# Patient Record
Sex: Male | Born: 1980
Health system: Southern US, Community
[De-identification: ages and names within clinical notes are randomized; demographics above are authoritative.]

## PROBLEM LIST (undated history)

## (undated) DIAGNOSIS — I10 Essential (primary) hypertension: Secondary | ICD-10-CM

## (undated) DIAGNOSIS — R569 Unspecified convulsions: Secondary | ICD-10-CM

## (undated) DIAGNOSIS — F988 Other specified behavioral and emotional disorders with onset usually occurring in childhood and adolescence: Secondary | ICD-10-CM

## (undated) HISTORY — DX: Other specified behavioral and emotional disorders with onset usually occurring in childhood and adolescence: F98.8

## (undated) HISTORY — DX: Unspecified convulsions: R56.9

---

## 2002-12-07 ENCOUNTER — Emergency Department (HOSPITAL_COMMUNITY): Admission: EM | Admit: 2002-12-07 | Discharge: 2002-12-07 | Payer: Self-pay | Admitting: Emergency Medicine

## 2007-08-10 HISTORY — PX: PERCUTANEOUS FIXATION TIBIAL SHAFT FRACTURE W/ PINS / SCREWS: SUR1009

## 2008-05-01 ENCOUNTER — Emergency Department (HOSPITAL_COMMUNITY): Admission: EM | Admit: 2008-05-01 | Discharge: 2008-05-01 | Payer: Self-pay | Admitting: Emergency Medicine

## 2008-07-25 ENCOUNTER — Ambulatory Visit (HOSPITAL_BASED_OUTPATIENT_CLINIC_OR_DEPARTMENT_OTHER): Admission: RE | Admit: 2008-07-25 | Discharge: 2008-07-25 | Payer: Self-pay | Admitting: Orthopedic Surgery

## 2010-12-22 NOTE — Op Note (Signed)
Willie Keller, Willie Keller NO.:  [id number]   MEDICAL RECORD NO.:  [id number]          PATIENT TYPE:  AMB   LOCATION:  DSC                          FACILITY:  [facility]   PHYSICIAN:  Rodney A. Mortenson, M.D.DATE OF BIRTH:  Jan 27, 1981   DATE OF PROCEDURE:  07/25/2008  DATE OF DISCHARGE:                               OPERATIVE REPORT   JUSTIFICATION:  A 30 year old male had a fractured clavicle and had  insertion of Rockwood pin in the left clavicle.  The fracture was  healed.  He is having some irritation at the tip of the pin and the pin  is going to be removed today.  Complication is discussed preoperatively.  Questions are answered and encouraged.   PREOPERATIVE DIAGNOSIS:  Rockwood pin, left clavicle.   POSTOPERATIVE DIAGNOSIS:  Rockwood pin, left clavicle.   OPERATION:  Removal of Rockwood intramedullary clavicle pin, left  shoulder.   SURGEON:  Lenard Galloway. Chaney Malling, MD   ANESTHESIA:  General.   PROCEDURE:  The patient was placed on the operating table in the supine  position.  After satisfactory of anesthesia, the patient was placed in  semi-sitting position.  The left upper extremity was prepped with  DuraPrep and draped out in the usual manner.  An incision was made over  the posterior aspect of the clavicle in the area of the pin exit.  Dissection was carried down to the pin.  Bleeders were coagulated.  A  small bursa was debrided with a rongeur.  A T-handled wrench was placed  over the clavicle pin and the pin was backed out in its entirety without  any complications.  The wound was then closed with interrupted 3-0 nylon  sutures and Steri-Strips with benzoin.  Sterile dressing was applied.  The patient returned to the recovery room in excellent condition.  Technically, this went extremely well.   DISPOSITION:  1. Percocet for pain.  2. To my office next week.  3. Usual postop instructions given to the patient.      Rodney A. Chaney Malling, M.D.  Electronically Signed     RAM/MEDQ  D:  07/25/2008  T:  07/25/2008  Job:  [id number]

## 2011-05-14 LAB — POCT HEMOGLOBIN-HEMACUE: Hemoglobin: 15.4 g/dL (ref 13.0–17.0)

## 2013-05-31 ENCOUNTER — Encounter: Payer: Self-pay | Admitting: Nurse Practitioner

## 2013-06-01 ENCOUNTER — Encounter: Payer: Self-pay | Admitting: Nurse Practitioner

## 2013-06-01 ENCOUNTER — Ambulatory Visit (INDEPENDENT_AMBULATORY_CARE_PROVIDER_SITE_OTHER): Payer: BC Managed Care – PPO | Admitting: Nurse Practitioner

## 2013-06-01 VITALS — BP 144/84 | HR 68 | Ht 73.0 in | Wt 250.0 lb

## 2013-06-01 DIAGNOSIS — G40309 Generalized idiopathic epilepsy and epileptic syndromes, not intractable, without status epilepticus: Secondary | ICD-10-CM | POA: Insufficient documentation

## 2013-06-01 DIAGNOSIS — Z79899 Other long term (current) drug therapy: Secondary | ICD-10-CM

## 2013-06-01 NOTE — Patient Instructions (Signed)
Patient to continue Depakote will refill for one year CBC comprehensive metabolic profile and Depakote level today Followup yearly and when necessary

## 2013-06-01 NOTE — Progress Notes (Signed)
[facility]  PATIENT: Willie Keller DOB: 1980-12-31   REASON FOR VISIT: follow up for seizure    HISTORY OF PRESENT ILLNESS:[name],  32 year old white male returns today for followup. He has a history of primary generalized epilepsy with last seizure being April 2004. He also has a history of attention deficit disorder currently on no medication and says it really has not been a problem  since high school. He is currently on Depakote generic extended release and has tolerated the medication without side effects. No new neurologic complaints. No primary care provider.     REVIEW OF SYSTEMS: Full 14 system review of systems performed and notable only for:  Constitutional: N/A  Cardiovascular: N/A  Ear/Nose/Throat: N/A  Skin: N/A  Eyes: N/A  Respiratory: N/A  Gastroitestinal: N/A  Hematology/Lymphatic: N/A  Endocrine: N/A Musculoskeletal:N/A  Allergy/Immunology: N/A  Neurological: N/A Psychiatric: N/A   ALLERGIES: No Known Allergies  HOME MEDICATIONS: Outpatient Prescriptions Prior to Visit  Medication Sig Dispense Refill  . divalproex (DEPAKOTE ER) 500 MG 24 hr tablet Take 500 mg by mouth 3 (three) times daily. At bedtime       No facility-administered medications prior to visit.    PAST MEDICAL HISTORY: Past Medical History  Diagnosis Date  . ADD (attention deficit disorder)   . Seizures     PAST SURGICAL HISTORY: Past Surgical History  Procedure Laterality Date  . Percutaneous fixation tibial shaft fracture w/ pins / screws  2009    clavicle     FAMILY HISTORY: History reviewed. No pertinent family history.  SOCIAL HISTORY: History   Social History  . Marital Status: Single    Spouse Name: N/A    Number of Children: 0  . Years of Education: College   Occupational History  . Unemployed    Social History Main Topics  . Smoking status: Never Smoker   . Smokeless tobacco: Never Used  . Alcohol Use: Yes     Comment: 64  oz week  . Drug Use: No  . Sexual Activity: Not on file   Other Topics Concern  . Not on file   Social History Narrative   Patient lives at home with with a roommate.    Patient is unemployed.    Patient has a college education.            PHYSICAL EXAM  Filed Vitals:   06/01/13 1001  BP: 144/84  Pulse: 68  Height: 6\' 1"  (1.854 m)  Weight: 250 lb (113.399 kg)   Body mass index is 32.99 kg/(m^2).  Generalized: Well developed, obese male in no acute distress  Neurological examination   Mentation: Alert oriented to time, place, history taking. Follows all commands speech and language fluent  Cranial nerve II-XII: Pupils were equal round reactive to light extraocular movements were full, visual field were full on confrontational test. Facial sensation and strength were normal. Hearing was intact to finger rubbing bilaterally. Uvula tongue midline. head turning and shoulder shrug and were normal and symmetric.Tongue protrusion into cheek strength was normal. Motor: normal bulk and tone, full strength in the BUE, BLE, fine finger movements normal, no pronator drift. No focal weakness Coordination: finger-nose-finger, heel-to-shin bilaterally, no dysmetria Reflexes: Brachioradialis 2/2, biceps 2/2, triceps 2/2, patellar 2/2, Achilles 2/2, plantar responses were flexor bilaterally. Gait and Station: Rising up from seated position without assistance, normal stance,  moderate stride, good arm swing, smooth turning, able to perform tiptoe, and heel walking without difficulty.   DIAGNOSTIC  DATA (LABS, IMAGING, TESTING) - None to review    ASSESSMENT AND PLAN  32 y.o. year old male  has a past medical history of ADD (attention deficit disorder) and Seizures. here to followup. Currently on Depakote 1500 mg total dose last generalized seizure in April 2004 however patient states he had an aura of seizure last month but it did not progress.   Patient to continue Depakote will refill  for one year after labs return CBC , comprehensive metabolic profile and Depakote level today,  Followup yearly and when necessary Needs PCP Nilda Riggs, [facility], [facility], APRN  [facility] [address], [address] [place], [place] [address] [phone]

## 2013-06-02 LAB — COMPREHENSIVE METABOLIC PANEL
Albumin: 4.1 g/dL (ref 3.5–5.5)
Alkaline Phosphatase: 78 IU/L (ref 39–117)
BUN/Creatinine Ratio: 13 (ref 8–19)
BUN: 11 mg/dL (ref 6–20)
Creatinine, Ser: 0.84 mg/dL (ref 0.76–1.27)
GFR calc Af Amer: 136 mL/min/{1.73_m2} (ref >59)
GFR calc non Af Amer: 117 mL/min/{1.73_m2} (ref >59)
Globulin, Total: 2.8 g/dL (ref 1.5–4.5)
Glucose: 91 mg/dL (ref 65–99)
Total Bilirubin: 0.1 mg/dL (ref 0.0–1.2)
Total Protein: 6.9 g/dL (ref 6.0–8.5)

## 2013-06-02 LAB — CBC WITH DIFFERENTIAL/PLATELET
Basos: 1 %
Eos: 7 %
Lymphs: 43 %
MCV: 75 fL — ABNORMAL LOW (ref 79–97)
Monocytes: 10 %
Neutrophils Relative %: 39 %
RBC: 5.12 x10E6/uL (ref 4.14–5.80)
WBC: 9.7 10*3/uL (ref 3.4–10.8)

## 2013-06-04 ENCOUNTER — Other Ambulatory Visit: Payer: Self-pay | Admitting: Nurse Practitioner

## 2013-06-04 MED ORDER — DIVALPROEX SODIUM ER 500 MG PO TB24: ORAL_TABLET | ORAL | Status: DC

## 2013-06-04 NOTE — Progress Notes (Signed)
Quick Note:  I called and gave results normal for pt to home #. ______

## 2013-06-28 ENCOUNTER — Ambulatory Visit: Payer: Self-pay | Admitting: Nurse Practitioner

## 2013-07-19 NOTE — Progress Notes (Signed)
I reviewed note and agree with plan.   VIKRAM R. PENUMALLI, MD 07/19/2013, 12:43 PM Certified in Neurology, Neurophysiology and Neuroimaging  [facility] [address], [address] [place], [place] [address] [phone]  

## 2014-04-09 ENCOUNTER — Ambulatory Visit (INDEPENDENT_AMBULATORY_CARE_PROVIDER_SITE_OTHER): Payer: BC Managed Care – PPO | Admitting: Nurse Practitioner

## 2014-04-09 ENCOUNTER — Encounter: Payer: Self-pay | Admitting: Nurse Practitioner

## 2014-04-09 VITALS — BP 138/80 | HR 70 | Ht 73.0 in | Wt 245.0 lb

## 2014-04-09 DIAGNOSIS — G40309 Generalized idiopathic epilepsy and epileptic syndromes, not intractable, without status epilepticus: Secondary | ICD-10-CM

## 2014-04-09 DIAGNOSIS — Z5181 Encounter for therapeutic drug level monitoring: Secondary | ICD-10-CM

## 2014-04-09 MED ORDER — DIVALPROEX SODIUM ER 500 MG PO TB24: ORAL_TABLET | ORAL | Status: DC

## 2014-04-09 NOTE — Patient Instructions (Signed)
Continue Depakote at current dose. Will check labs today, CBC, CMP, and VPA level. Call for any seizure activity F/U in 1 year and prn

## 2014-04-09 NOTE — Progress Notes (Signed)
[facility]  PATIENT: Willie Keller DOB: 12-04-1980   REASON FOR VISIT: folllowup seizure disorder   HISTORY OF PRESENT ILLNESS:[name], 33 year old white male returns today for followup. He was last seen 06/01/2013. He has a history of primary generalized epilepsy with last seizure being April 2004. He also has a history of attention deficit disorder currently on no medication and says it really has not been a problem since high school. He is currently on Depakote generic extended release and has tolerated the medication without side effects. No new neurologic complaints. No primary care provider.    REVIEW OF SYSTEMS: Full 14 system review of systems performed and notable only for those listed, all others are neg:  Constitutional: N/A  Cardiovascular: N/A  Ear/Nose/Throat: N/A  Skin: N/A  Eyes: N/A  Respiratory: N/A  Gastroitestinal: N/A  Hematology/Lymphatic: N/A  Endocrine: N/A Musculoskeletal:N/A  Allergy/Immunology: N/A  Neurological: N/A Psychiatric: N/A Sleep : NA   ALLERGIES: No Known Allergies  HOME MEDICATIONS: Outpatient Prescriptions Prior to Visit  Medication Sig Dispense Refill  . divalproex (DEPAKOTE ER) 500 MG 24 hr tablet 3 caps at bedtime  90 tablet  11   No facility-administered medications prior to visit.    PAST MEDICAL HISTORY: Past Medical History  Diagnosis Date  . ADD (attention deficit disorder)   . Seizures     PAST SURGICAL HISTORY: Past Surgical History  Procedure Laterality Date  . Percutaneous fixation tibial shaft fracture w/ pins / screws  2009    clavicle     FAMILY HISTORY: History reviewed. No pertinent family history.  SOCIAL HISTORY: History   Social History  . Marital Status: Single    Spouse Name: N/A    Number of Children: 0  . Years of Education: College   Occupational History  . Unemployed    Social History Main Topics  . Smoking status: Never Smoker   . Smokeless tobacco:  Never Used  . Alcohol Use: Yes     Comment: 64 oz week  . Drug Use: No  . Sexual Activity: Not on file   Other Topics Concern  . Not on file   Social History Narrative   Patient lives at home with with a roommate.    Patient is unemployed.    Patient has a college education.            PHYSICAL EXAM  Filed Vitals:   04/09/14 1501  BP: 138/80  Pulse: 70  Height:  (1.854 m)  Weight: 245 lb (111.131 kg)   Body mass index is 32.33 kg/(m^2). Generalized: Well developed, obese male in no acute distress  Neurological examination  Mentation: Alert oriented to time, place, history taking. Follows all commands speech and language fluent  Cranial nerve II-XII: Pupils were equal round reactive to light extraocular movements were full, visual field were full on confrontational test. Facial sensation and strength were normal. Hearing was intact to finger rubbing bilaterally. Uvula tongue midline. head turning and shoulder shrug and were normal and symmetric.Tongue protrusion into cheek strength was normal.  Motor: normal bulk and tone, full strength in the BUE, BLE, fine finger movements normal, no pronator drift. No focal weakness  Coordination: finger-nose-finger, heel-to-shin bilaterally, no dysmetria  Reflexes: Brachioradialis 2/2, biceps 2/2, triceps 2/2, patellar 2/2, Achilles 2/2, plantar responses were flexor bilaterally.  Gait and Station: Rising up from seated position without assistance, normal stance, moderate stride, good arm swing, smooth turning, able to perform tiptoe, and heel walking without  difficulty.    DIAGNOSTIC DATA (LABS, IMAGING, TESTING) - I reviewed patient records, labs, notes, testing and imaging myself where available.  Lab Results  Component Value Date   WBC 9.7 06/01/2013   HGB 12.8 06/01/2013   HCT 38.3 06/01/2013   MCV 75* 06/01/2013      Component Value Date/Time   NA 137 06/01/2013 1047   K 4.2 06/01/2013 1047   CL 103 06/01/2013 1047    CO2 21 06/01/2013 1047   GLUCOSE 91 06/01/2013 1047   BUN 11 06/01/2013 1047   CREATININE 0.84 06/01/2013 1047   CALCIUM 9.1 06/01/2013 1047   PROT 6.9 06/01/2013 1047   AST 17 06/01/2013 1047   ALT 26 06/01/2013 1047   ALKPHOS 78 06/01/2013 1047   BILITOT 0.1 06/01/2013 1047   GFRNONAA 117 06/01/2013 1047   GFRAA 136 06/01/2013 1047    ASSESSMENT AND PLAN  33 y.o. year old male  has a past medical history of ADD (attention deficit disorder) and Seizures. here to followup for seizure disorder. Last seizure in 2004.  Continue Depakote at current dose. Will check labs today, CBC, CMP, and VPA level. Call for any seizure activity F/U in 1 year and prn Nilda Riggs, [facility], APRN  [facility] [address], [address] [place], [place] [address] [phone]

## 2014-04-09 NOTE — Progress Notes (Signed)
I reviewed note and agree with plan.   Suanne Marker, MD 04/09/2014, 5:35 PM Certified in Neurology, Neurophysiology and Neuroimaging  [facility] [address] [place], [place] [address] [phone]

## 2014-04-10 ENCOUNTER — Telehealth: Payer: Self-pay | Admitting: Nurse Practitioner

## 2014-04-10 LAB — COMPREHENSIVE METABOLIC PANEL
A/G RATIO: 1.5 (ref 1.1–2.5)
ALK PHOS: 79 IU/L (ref 39–117)
ALT: 22 IU/L (ref 0–44)
AST: 17 IU/L (ref 0–40)
Albumin: 4.4 g/dL (ref 3.5–5.5)
BILIRUBIN TOTAL: 0.3 mg/dL (ref 0.0–1.2)
BUN/Creatinine Ratio: 13 (ref 8–19)
BUN: 13 mg/dL (ref 6–20)
CALCIUM: 9.4 mg/dL (ref 8.7–10.2)
CO2: 22 mmol/L (ref 18–29)
CREATININE: 1.03 mg/dL (ref 0.76–1.27)
Chloride: 102 mmol/L (ref 97–108)
GFR calc Af Amer: 112 mL/min/{1.73_m2} (ref >59)
GFR calc non Af Amer: 97 mL/min/{1.73_m2} (ref >59)
GLOBULIN, TOTAL: 3 g/dL (ref 1.5–4.5)
Glucose: 80 mg/dL (ref 65–99)
POTASSIUM: 4.8 mmol/L (ref 3.5–5.2)
SODIUM: 141 mmol/L (ref 134–144)
Total Protein: 7.4 g/dL (ref 6.0–8.5)

## 2014-04-10 LAB — CBC WITH DIFFERENTIAL
Basophils Absolute: 0.1 10*3/uL (ref 0.0–0.2)
Basos: 1 %
EOS: 7 %
Eosinophils Absolute: 0.5 10*3/uL — ABNORMAL HIGH (ref 0.0–0.4)
HCT: 41.2 % (ref 37.5–51.0)
Hemoglobin: 13.3 g/dL (ref 12.6–17.7)
IMMATURE GRANS (ABS): 0 10*3/uL (ref 0.0–0.1)
IMMATURE GRANULOCYTES: 0 %
Lymphocytes Absolute: 3 10*3/uL (ref 0.7–3.1)
Lymphs: 40 %
MCH: 23.6 pg — AB (ref 26.6–33.0)
MCHC: 32.3 g/dL (ref 31.5–35.7)
MCV: 73 fL — ABNORMAL LOW (ref 79–97)
MONOS ABS: 0.7 10*3/uL (ref 0.1–0.9)
Monocytes: 9 %
NEUTROS PCT: 43 %
Neutrophils Absolute: 3.2 10*3/uL (ref 1.4–7.0)
PLATELETS: 359 10*3/uL (ref 150–379)
RBC: 5.63 x10E6/uL (ref 4.14–5.80)
RDW: 18.1 % — ABNORMAL HIGH (ref 12.3–15.4)
WBC: 7.5 10*3/uL (ref 3.4–10.8)

## 2014-04-10 LAB — VALPROIC ACID LEVEL: Valproic Acid Lvl: 36 ug/mL — ABNORMAL LOW (ref 50–100)

## 2014-04-10 NOTE — Telephone Encounter (Signed)
TC to patient about VPA level of 37, he reports that he forgot to tell me he had missed the previous night's dose. Made him aware of importance of taking as ordered, to prevent seizure activity. He agrees

## 2014-04-22 ENCOUNTER — Ambulatory Visit: Payer: BC Managed Care – PPO | Admitting: Nurse Practitioner

## 2014-04-29 ENCOUNTER — Ambulatory Visit: Payer: BC Managed Care – PPO | Admitting: Nurse Practitioner

## 2015-04-10 ENCOUNTER — Ambulatory Visit: Payer: BC Managed Care – PPO | Admitting: Diagnostic Neuroimaging

## 2015-04-22 ENCOUNTER — Ambulatory Visit (INDEPENDENT_AMBULATORY_CARE_PROVIDER_SITE_OTHER): Payer: BLUE CROSS/BLUE SHIELD | Admitting: Diagnostic Neuroimaging

## 2015-04-22 ENCOUNTER — Encounter: Payer: Self-pay | Admitting: Diagnostic Neuroimaging

## 2015-04-22 VITALS — BP 146/89 | HR 89 | Ht 73.0 in | Wt 252.0 lb

## 2015-04-22 DIAGNOSIS — G40309 Generalized idiopathic epilepsy and epileptic syndromes, not intractable, without status epilepticus: Secondary | ICD-10-CM | POA: Diagnosis not present

## 2015-04-22 MED ORDER — DIVALPROEX SODIUM ER 500 MG PO TB24: 1000.0000 mg | ORAL_TABLET | Freq: Every day | ORAL | Status: DC

## 2015-04-22 NOTE — Patient Instructions (Signed)
Continue current medication: divalproex.

## 2015-04-22 NOTE — Progress Notes (Addendum)
[facility]  PATIENT: Willie Keller DOB: 04-17-1981  REFERRING CLINICIAN:  HISTORY FROM: patient  REASON FOR VISIT: follow up   HISTORICAL  CHIEF COMPLAINT:  Chief Complaint  Patient presents with  . Epilepsy    rm 7  . Follow-up    1 year    HISTORY OF PRESENT ILLNESS:   UPDATE 04/22/15: Patient doing well. No seizures since last visit. Tolerating medications. Last seizure was in May 2004 in setting of lack of sleep. In the past tried weaning off depakote x 2 in the past, but had breakthrough seizures.  PRIOR HPI (CM and [place]): Prior notes reviewed and summarized: Normal birth and development. Born full-term via C-section. Patient had closed head injury at age 14 years old, falling off 5 foot height from staircase onto concrete surface. No family history of seizures. Patient had history of brief absence seizures lasting 10-15 seconds with staring spell and speech arrest around age 11 years old. Patient also had episode of generalized tonic-clonic seizure at a restaurant while eating dinner with his father in April 1998. Patient was started on Depakote in 1998 by Dr. Sharene Skeans.   REVIEW OF SYSTEMS: Full 14 system review of systems performed and notable only for only as per HPI.  ALLERGIES: No Known Allergies  HOME MEDICATIONS: Outpatient Prescriptions Prior to Visit  Medication Sig Dispense Refill  . divalproex (DEPAKOTE ER) 500 MG 24 hr tablet 3 caps at bedtime 90 tablet 11   No facility-administered medications prior to visit.    PAST MEDICAL HISTORY: Past Medical History  Diagnosis Date  . ADD (attention deficit disorder)   . Seizures     PAST SURGICAL HISTORY: Past Surgical History  Procedure Laterality Date  . Percutaneous fixation tibial shaft fracture w/ pins / screws  2009    clavicle     FAMILY HISTORY: No family history on file.  SOCIAL HISTORY:  Social History   Social History  . Marital Status: Single    Spouse Name:  N/A  . Number of Children: 0  . Years of Education: College   Occupational History  . Unemployed    Social History Main Topics  . Smoking status: Never Smoker   . Smokeless tobacco: Never Used  . Alcohol Use: Yes     Comment: 64 oz week  . Drug Use: No  . Sexual Activity: Not on file   Other Topics Concern  . Not on file   Social History Narrative   Patient lives at home with with a roommate.    Patient is unemployed.    Patient has a college education.           PHYSICAL EXAM  GENERAL EXAM/CONSTITUTIONAL: Vitals:  Filed Vitals:   04/22/15 1453  BP: 146/89  Pulse: 89  Height:  (1.854 m)  Weight: 252 lb (114.306 kg)     Body mass index is 33.25 kg/(m^2).  No exam data present  Patient is in no distress; well developed, nourished and groomed; neck is supple  CARDIOVASCULAR:  Examination of carotid arteries is normal; no carotid bruits  Regular rate and rhythm, no murmurs  Examination of peripheral vascular system by observation and palpation is normal  EYES:  Ophthalmoscopic exam of optic discs and posterior segments is normal; no papilledema or hemorrhages  MUSCULOSKELETAL:  Gait, strength, tone, movements noted in Neurologic exam below  NEUROLOGIC: MENTAL STATUS:  No flowsheet data found.  awake, alert, oriented to person, place and time  recent and  remote memory intact  normal attention and concentration  language fluent, comprehension intact, naming intact,   fund of knowledge appropriate  CRANIAL NERVE:   2nd - no papilledema on fundoscopic exam  2nd, 3rd, 4th, 6th - pupils equal and reactive to light, visual fields full to confrontation, extraocular muscles intact, no nystagmus  5th - facial sensation symmetric  7th - facial strength symmetric  8th - hearing intact  9th - palate elevates symmetrically, uvula midline  11th - shoulder shrug symmetric  12th - tongue protrusion midline  MOTOR:   normal bulk and tone,  full strength in the BUE, BLE  SENSORY:   normal and symmetric to light touch, temperature, vibration    COORDINATION:   finger-nose-finger, fine finger movements normal  REFLEXES:   deep tendon reflexes present and symmetric  GAIT/STATION:   narrow based gait; able to walk tandem; romberg is negative    DIAGNOSTIC DATA (LABS, IMAGING, TESTING) - I reviewed patient records, labs, notes, testing and imaging myself where available.  Lab Results  Component Value Date   WBC 7.5 04/09/2014   HGB 13.3 04/09/2014   HCT 41.2 04/09/2014   MCV 73* 04/09/2014   PLT 359 04/09/2014      Component Value Date/Time   NA 141 04/09/2014 1526   K 4.8 04/09/2014 1526   CL 102 04/09/2014 1526   CO2 22 04/09/2014 1526   GLUCOSE 80 04/09/2014 1526   BUN 13 04/09/2014 1526   CREATININE 1.03 04/09/2014 1526   CALCIUM 9.4 04/09/2014 1526   PROT 7.4 04/09/2014 1526   AST 17 04/09/2014 1526   ALT 22 04/09/2014 1526   ALKPHOS 79 04/09/2014 1526   BILITOT 0.3 04/09/2014 1526   GFRNONAA 97 04/09/2014 1526   GFRAA 112 04/09/2014 1526   VALPROIC ACID LVL  Date Value Ref Range Status  04/09/2014 36* 50 - 100 ug/mL Final    Comment:                                    Detection Limit = 4                            <4 indicates None Detected Toxicity may occur at levels of 100-500. Measurements of free unbound valproic acid may improve the assess- ment of clinical response.  06/01/2013 72 50 - 100 ug/mL Final    Comment:                                    Detection Limit = 4                            <4 indicates None Detected Toxicity may occur at levels of 100-500. Measurements of free unbound valproic acid may improve the assess- ment of clinical response.   No results found for: CHOL, HDL, LDLCALC, LDLDIRECT, TRIG, CHOLHDL No results found for: ZOXW9U No results found for: VITAMINB12 No results found for: TSH   09/10/96 EEG - paroxysmal generalized delta range activity that was  bilateral and usually generalized 09/10/98 EEG - normal 03/30/02 EEG - normal     ASSESSMENT AND PLAN  34 y.o. year old male here with primary generalized epilepsy, doing well on depakote 500mg  BID. Last seizure  in May 2004. Failed weaning off meds x 2 in the past. Will plan for lifelong therapy.   Dx: Generalized idiopathic epilepsy, not intractable, without status epilepticus    PLAN: I spent 25 minutes of face to face time with patient. Greater than 50% of time was spent in counseling and coordination of care with patient. In summary we discussed: - continue divalproex SR 500mg  (2 tabs PO qhs) - nutrition and physical activity strategies reviewed - recommend patient to establish with PCP to monitor BP (144/89)  Orders Placed This Encounter  Procedures  . CBC with Differential/Platelet  . Comprehensive metabolic panel   Meds ordered this encounter  Medications  . divalproex (DEPAKOTE ER) 500 MG 24 hr tablet    Sig: Take 2 tablets (1,000 mg total) by mouth at bedtime.    Dispense:  180 tablet    Refill:  4   Return in about 1 year (around 04/21/2016).    Suanne Marker, MD 04/22/2015, 3:02 PM Certified in Neurology, Neurophysiology and Neuroimaging  [facility] [address] [place], [place] [address] [phone]

## 2015-04-23 LAB — COMPREHENSIVE METABOLIC PANEL
ALK PHOS: 79 IU/L (ref 39–117)
ALT: 44 IU/L (ref 0–44)
AST: 23 IU/L (ref 0–40)
Albumin/Globulin Ratio: 1.5 (ref 1.1–2.5)
Albumin: 4.3 g/dL (ref 3.5–5.5)
BUN/Creatinine Ratio: 13 (ref 8–19)
BUN: 11 mg/dL (ref 6–20)
Bilirubin Total: 0.3 mg/dL (ref 0.0–1.2)
CO2: 24 mmol/L (ref 18–29)
CREATININE: 0.85 mg/dL (ref 0.76–1.27)
Calcium: 10 mg/dL (ref 8.7–10.2)
Chloride: 101 mmol/L (ref 97–108)
GFR calc Af Amer: 133 mL/min/{1.73_m2} (ref >59)
GFR calc non Af Amer: 115 mL/min/{1.73_m2} (ref >59)
GLUCOSE: 92 mg/dL (ref 65–99)
Globulin, Total: 2.8 g/dL (ref 1.5–4.5)
Potassium: 4.3 mmol/L (ref 3.5–5.2)
Sodium: 140 mmol/L (ref 134–144)
Total Protein: 7.1 g/dL (ref 6.0–8.5)

## 2015-04-23 LAB — CBC WITH DIFFERENTIAL/PLATELET
BASOS ABS: 0.1 10*3/uL (ref 0.0–0.2)
Basos: 1 %
EOS (ABSOLUTE): 0.5 10*3/uL — ABNORMAL HIGH (ref 0.0–0.4)
Eos: 6 %
HEMOGLOBIN: 15.3 g/dL (ref 12.6–17.7)
Hematocrit: 45.3 % (ref 37.5–51.0)
IMMATURE GRANULOCYTES: 0 %
Immature Grans (Abs): 0 10*3/uL (ref 0.0–0.1)
LYMPHS ABS: 2.6 10*3/uL (ref 0.7–3.1)
Lymphs: 33 %
MCH: 29 pg (ref 26.6–33.0)
MCHC: 33.8 g/dL (ref 31.5–35.7)
MCV: 86 fL (ref 79–97)
MONOCYTES: 9 %
MONOS ABS: 0.7 10*3/uL (ref 0.1–0.9)
NEUTROS PCT: 51 %
Neutrophils Absolute: 4 10*3/uL (ref 1.4–7.0)
Platelets: 295 10*3/uL (ref 150–379)
RBC: 5.28 x10E6/uL (ref 4.14–5.80)
RDW: 15.7 % — AB (ref 12.3–15.4)
WBC: 7.8 10*3/uL (ref 3.4–10.8)

## 2015-04-24 ENCOUNTER — Telehealth: Payer: Self-pay | Admitting: *Deleted

## 2015-04-24 NOTE — Telephone Encounter (Signed)
Spoke with patient and informed him, per Dr Marjory Lies, all lab results are normal. Patient verbalized understanding, appreciation.

## 2015-05-10 ENCOUNTER — Other Ambulatory Visit: Payer: Self-pay | Admitting: Nurse Practitioner

## 2015-07-10 ENCOUNTER — Telehealth: Payer: Self-pay | Admitting: *Deleted

## 2015-07-10 NOTE — Telephone Encounter (Signed)
Form,[facility] and Dr Marjory LiesPenumalli 07/10/15.

## 2015-07-11 DIAGNOSIS — Z0289 Encounter for other administrative examinations: Secondary | ICD-10-CM

## 2015-07-14 NOTE — Telephone Encounter (Signed)
DMV form on Dr Visteon CorporationPenumalli's desk for review, signature.

## 2015-07-16 NOTE — Telephone Encounter (Signed)
DMV forms completed, signed, sent to [facility] for processing. 

## 2015-07-17 ENCOUNTER — Telehealth: Payer: Self-pay | Admitting: *Deleted

## 2015-07-17 NOTE — Telephone Encounter (Signed)
Form,DMV received,completd by Dr Marjory LiesPenumalli and Pincus SanesMary C faxed 07/17/15.

## 2016-04-13 ENCOUNTER — Encounter: Payer: Self-pay | Admitting: Diagnostic Neuroimaging

## 2016-04-13 ENCOUNTER — Ambulatory Visit (INDEPENDENT_AMBULATORY_CARE_PROVIDER_SITE_OTHER): Payer: BLUE CROSS/BLUE SHIELD | Admitting: Diagnostic Neuroimaging

## 2016-04-13 VITALS — BP 143/84 | HR 71 | Wt 241.0 lb

## 2016-04-13 DIAGNOSIS — G40309 Generalized idiopathic epilepsy and epileptic syndromes, not intractable, without status epilepticus: Secondary | ICD-10-CM | POA: Diagnosis not present

## 2016-04-13 MED ORDER — DIVALPROEX SODIUM ER 500 MG PO TB24: 1000.0000 mg | ORAL_TABLET | Freq: Every day | ORAL | 4 refills | Status: DC

## 2016-04-13 NOTE — Patient Instructions (Addendum)
Thank you for coming to see [place] at [facility]. I hope we have been able to provide you high quality care today.  You may receive a patient satisfaction survey over the next few weeks. We would appreciate your feedback and comments so that we may continue to improve ourselves and the health of our patients.  - consider smart scale and smart BP machine at home to monitor health factors   ~~~~~~~~~~~~~~~~~~~~~~~~~~~~~~~~~~~~~~~~~~~~~~~~~~~~~~~~~~~~~~~~~  DR. PENUMALLI'S GUIDE TO HAPPY AND HEALTHY LIVING These are some of my general health and wellness recommendations. Some of them may apply to you better than others. Please use common sense as you try these suggestions and feel free to ask me any questions.   ACTIVITY/FITNESS Mental, social, emotional and physical stimulation are very important for brain and body health. Try learning a new activity (arts, music, language, sports, games).  Keep moving your body to the best of your abilities. You can do this at home, inside or outside, the park, community center, gym or anywhere you like. Consider a physical therapist or personal trainer to get started. Consider the app Sworkit. Fitness trackers such as smart-watches, smart-phones or Fitbits can help as well.   NUTRITION Eat more plants: colorful vegetables, nuts, seeds and berries.  Eat less sugar, salt, preservatives and processed foods.  Avoid toxins such as cigarettes and alcohol.  Drink water when you are thirsty. Warm water with a slice of lemon is an excellent morning drink to start the day.  Consider these websites for more information The Nutrition Source (https://www.henry-hernandez.biz/) Precision Nutrition (WindowBlog.ch)   RELAXATION Consider practicing mindfulness meditation or other relaxation techniques such as deep breathing, prayer, yoga, tai chi, massage. See website mindful.org or the apps Headspace or Calm  to help get started.   SLEEP Try to get at least 7-8+ hours sleep per day. Regular exercise and reduced caffeine will help you sleep better. Practice good sleep hygeine techniques. See website sleep.org for more information.   PLANNING Prepare estate planning, living will, healthcare POA documents. Sometimes this is best planned with the help of an attorney. Theconversationproject.org and agingwithdignity.org are excellent resources.

## 2016-04-13 NOTE — Progress Notes (Signed)
[facility]  PATIENT: Willie Keller DOB: 01-24-1981  REFERRING CLINICIAN:  HISTORY FROM: patient  REASON FOR VISIT: follow up   HISTORICAL  CHIEF COMPLAINT:  Chief Complaint  Patient presents with  . Seizures    rm 7, "no seizure activity"  . Follow-up    one year    HISTORY OF PRESENT ILLNESS:   UPDATE 04/13/16: Since last visit. No seizures. Tolerating meds. Still with slightly elevated BP. No PCP yet.  UPDATE 04/22/15: Patient doing well. No seizures since last visit. Tolerating medications. Last seizure was in May 2004 in setting of lack of sleep. In the past tried weaning off depakote x 2 in the past, but had breakthrough seizures.  PRIOR HPI (CM and Hickling): Prior notes reviewed and summarized: Normal birth and development. Born full-term via C-section. Patient had closed head injury at age 47 years old, falling off 5 foot height from staircase onto concrete surface. No family history of seizures. Patient had history of brief absence seizures lasting 10-15 seconds with staring spell and speech arrest around age 39 years old. Patient also had episode of generalized tonic-clonic seizure at a restaurant while eating dinner with his father in April 1998. Patient was started on Depakote in 1998 by Dr. Sharene Skeans.   REVIEW OF SYSTEMS: Full 14 system review of systems performed and negative except as per HPI.   ALLERGIES: No Known Allergies  HOME MEDICATIONS: Outpatient Medications Prior to Visit  Medication Sig Dispense Refill  . divalproex (DEPAKOTE ER) 500 MG 24 hr tablet Take 2 tablets (1,000 mg total) by mouth at bedtime. 180 tablet 4  . divalproex (DEPAKOTE ER) 500 MG 24 hr tablet Take 2 tablets (1,000 mg total) by mouth at bedtime. 180 tablet 4   No facility-administered medications prior to visit.     PAST MEDICAL HISTORY: Past Medical History:  Diagnosis Date  . ADD (attention deficit disorder)   . Seizures (HCC)     PAST SURGICAL  HISTORY: Past Surgical History:  Procedure Laterality Date  . PERCUTANEOUS FIXATION TIBIAL SHAFT FRACTURE W/ PINS / SCREWS  2009   clavicle     FAMILY HISTORY: Family History  Problem Relation Age of Onset  . Healthy Mother   . Healthy Father   . Healthy Sister     SOCIAL HISTORY:  Social History   Social History  . Marital status: Single    Spouse name: N/A  . Number of children: 0  . Years of education: College   Occupational History  . Unemployed    Social History Main Topics  . Smoking status: Current Every Day Smoker    Types: Cigars  . Smokeless tobacco: Never Used     Comment: 04/13/16 cigars once a month  . Alcohol use 2.4 oz/week    4 Shots of liquor per week  . Drug use: No  . Sexual activity: Not on file   Other Topics Concern  . Not on file   Social History Narrative   Patient lives at home with with a roommate.    Patient is unemployed.    Patient has a college education.           PHYSICAL EXAM  GENERAL EXAM/CONSTITUTIONAL: Vitals:  Vitals:   04/13/16 1103  BP: (!) 143/84  Pulse: 71  Weight: 241 lb (109.3 kg)   Body mass index is 31.8 kg/m. No exam data present  Patient is in no distress; well developed, nourished and groomed; neck is supple  CARDIOVASCULAR:  Examination of carotid arteries is normal; no carotid bruits  Regular rate and rhythm, no murmurs  Examination of peripheral vascular system by observation and palpation is normal  EYES:  Ophthalmoscopic exam of optic discs and posterior segments is normal; no papilledema or hemorrhages  MUSCULOSKELETAL:  Gait, strength, tone, movements noted in Neurologic exam below  NEUROLOGIC: MENTAL STATUS:  No flowsheet data found.  awake, alert, oriented to person, place and time  recent and remote memory intact  normal attention and concentration  language fluent, comprehension intact, naming intact,   fund of knowledge appropriate  CRANIAL NERVE:   2nd - no  papilledema on fundoscopic exam  2nd, 3rd, 4th, 6th - pupils equal and reactive to light, visual fields full to confrontation, extraocular muscles intact, no nystagmus  5th - facial sensation symmetric  7th - facial strength symmetric  8th - hearing intact  9th - palate elevates symmetrically, uvula midline  11th - shoulder shrug symmetric  12th - tongue protrusion midline  MOTOR:   normal bulk and tone, full strength in the BUE, BLE  SENSORY:   normal and symmetric to light touch, temperature, vibration    COORDINATION:   finger-nose-finger, fine finger movements normal  REFLEXES:   deep tendon reflexes present and symmetric  GAIT/STATION:   narrow based gait; able to walk tandem; romberg is negative    DIAGNOSTIC DATA (LABS, IMAGING, TESTING) - I reviewed patient records, labs, notes, testing and imaging myself where available.  Lab Results  Component Value Date   WBC 7.8 04/22/2015   HGB 13.3 04/09/2014   HCT 45.3 04/22/2015   MCV 86 04/22/2015   PLT 295 04/22/2015      Component Value Date/Time   NA 140 04/22/2015 1612   K 4.3 04/22/2015 1612   CL 101 04/22/2015 1612   CO2 24 04/22/2015 1612   GLUCOSE 92 04/22/2015 1612   BUN 11 04/22/2015 1612   CREATININE 0.85 04/22/2015 1612   CALCIUM 10.0 04/22/2015 1612   PROT 7.1 04/22/2015 1612   ALBUMIN 4.3 04/22/2015 1612   AST 23 04/22/2015 1612   ALT 44 04/22/2015 1612   ALKPHOS 79 04/22/2015 1612   BILITOT 0.3 04/22/2015 1612   GFRNONAA 115 04/22/2015 1612   GFRAA 133 04/22/2015 1612   Valproic Acid Lvl  Date Value Ref Range Status  04/09/2014 36 (L) 50 - 100 ug/mL Final    Comment:                                    Detection Limit = 4                            <4 indicates None Detected Toxicity may occur at levels of 100-500. Measurements of free unbound valproic acid may improve the assess- ment of clinical response.  06/01/2013 72 50 - 100 ug/mL Final    Comment:                                     Detection Limit = 4                            <4 indicates None Detected Toxicity may occur at levels of 100-500. Measurements of free unbound valproic acid  may improve the assess- ment of clinical response.   No results found for: CHOL, HDL, LDLCALC, LDLDIRECT, TRIG, CHOLHDL No results found for: ZOXW9UHGBA1C No results found for: VITAMINB12 No results found for: TSH   09/10/96 EEG - paroxysmal generalized delta range activity that was bilateral and usually generalized 09/10/98 EEG - normal 03/30/02 EEG - normal     ASSESSMENT AND PLAN  35 y.o. year old male here with primary generalized epilepsy, doing well on depakote 500mg  BID. Last seizure in May 2004. Failed weaning off meds x 2 in the past. Will plan for lifelong therapy.    Dx:  Generalized idiopathic epilepsy, not intractable, without status epilepticus (HCC) - Plan: CBC with Differential/Platelet, CMP    PLAN: I spent 25 minutes of face to face time with patient. Greater than 50% of time was spent in counseling and coordination of care with patient. In summary we discussed: - continue divalproex ER 500mg  (2 tabs PO qhs) - nutrition and physical activity strategies reviewed - recommend patient to establish with PCP to monitor BP (143/84); reviewed nutrition, exercise, alcohol and sleep related factors; advised to consider BP and weight monitoring at home   Orders Placed This Encounter  Procedures  . CBC with Differential/Platelet  . CMP   Meds ordered this encounter  Medications  . divalproex (DEPAKOTE ER) 500 MG 24 hr tablet    Sig: Take 2 tablets (1,000 mg total) by mouth at bedtime.    Dispense:  180 tablet    Refill:  4   Return in about 1 year (around 04/13/2017).    Suanne MarkerVIKRAM R. Jermon Chalfant, MD 04/13/2016, 11:18 AM Certified in Neurology, Neurophysiology and Neuroimaging  [facility] [address], [address] [place], [place] [id number] [phone]

## 2016-04-14 ENCOUNTER — Telehealth: Payer: Self-pay | Admitting: *Deleted

## 2016-04-14 LAB — COMPREHENSIVE METABOLIC PANEL
ALK PHOS: 74 IU/L (ref 39–117)
ALT: 21 IU/L (ref 0–44)
AST: 14 IU/L (ref 0–40)
Albumin/Globulin Ratio: 1.5 (ref 1.2–2.2)
Albumin: 4.2 g/dL (ref 3.5–5.5)
BUN/Creatinine Ratio: 18 (ref 9–20)
BUN: 16 mg/dL (ref 6–20)
Bilirubin Total: 0.3 mg/dL (ref 0.0–1.2)
CALCIUM: 9.3 mg/dL (ref 8.7–10.2)
CO2: 24 mmol/L (ref 18–29)
CREATININE: 0.9 mg/dL (ref 0.76–1.27)
Chloride: 103 mmol/L (ref 96–106)
GFR calc Af Amer: 130 mL/min/{1.73_m2} (ref >59)
GFR, EST NON AFRICAN AMERICAN: 113 mL/min/{1.73_m2} (ref >59)
GLUCOSE: 85 mg/dL (ref 65–99)
Globulin, Total: 2.8 g/dL (ref 1.5–4.5)
Potassium: 4.3 mmol/L (ref 3.5–5.2)
Sodium: 143 mmol/L (ref 134–144)
Total Protein: 7 g/dL (ref 6.0–8.5)

## 2016-04-14 LAB — CBC WITH DIFFERENTIAL/PLATELET
BASOS ABS: 0 10*3/uL (ref 0.0–0.2)
Basos: 0 %
EOS (ABSOLUTE): 0.4 10*3/uL (ref 0.0–0.4)
EOS: 5 %
Hematocrit: 45 % (ref 37.5–51.0)
Hemoglobin: 15.1 g/dL (ref 12.6–17.7)
IMMATURE GRANULOCYTES: 0 %
Immature Grans (Abs): 0 10*3/uL (ref 0.0–0.1)
Lymphocytes Absolute: 3.2 10*3/uL — ABNORMAL HIGH (ref 0.7–3.1)
Lymphs: 42 %
MCH: 30.6 pg (ref 26.6–33.0)
MCHC: 33.6 g/dL (ref 31.5–35.7)
MCV: 91 fL (ref 79–97)
MONOS ABS: 0.7 10*3/uL (ref 0.1–0.9)
Monocytes: 9 %
NEUTROS PCT: 44 %
Neutrophils Absolute: 3.3 10*3/uL (ref 1.4–7.0)
PLATELETS: 267 10*3/uL (ref 150–379)
RBC: 4.93 x10E6/uL (ref 4.14–5.80)
RDW: 14.8 % (ref 12.3–15.4)
WBC: 7.5 10*3/uL (ref 3.4–10.8)

## 2016-04-14 NOTE — Telephone Encounter (Signed)
Per Dr Danae OrleansPenumali, spoke with patient and informed him his labs are unremarkable; Dr Marjory LiesPenumalli will continue with his current treatment plan. Patient verbalized understanding.

## 2017-04-18 ENCOUNTER — Encounter: Payer: Self-pay | Admitting: Diagnostic Neuroimaging

## 2017-04-18 ENCOUNTER — Ambulatory Visit (INDEPENDENT_AMBULATORY_CARE_PROVIDER_SITE_OTHER): Payer: BLUE CROSS/BLUE SHIELD | Admitting: Diagnostic Neuroimaging

## 2017-04-18 VITALS — BP 143/89 | HR 68 | Ht 73.0 in | Wt 253.0 lb

## 2017-04-18 DIAGNOSIS — G40309 Generalized idiopathic epilepsy and epileptic syndromes, not intractable, without status epilepticus: Secondary | ICD-10-CM | POA: Diagnosis not present

## 2017-04-18 MED ORDER — DIVALPROEX SODIUM ER 500 MG PO TB24: 1000.0000 mg | ORAL_TABLET | Freq: Every day | ORAL | 4 refills | Status: DC

## 2017-04-18 NOTE — Progress Notes (Signed)
[facility]  PATIENT: Willie Keller DOB: 1981/01/20  REFERRING CLINICIAN:  HISTORY FROM: patient REASON FOR VISIT: follow up   HISTORICAL  CHIEF COMPLAINT:  Chief Complaint  Patient presents with  . Follow-up  . Seizures    doing well, no sz    HISTORY OF PRESENT ILLNESS:   UPDATE (04/18/17, VRP): Since last visit, doing well. Tolerating divalproex. No alleviating or aggravating factors. No seizures.  UPDATE 04/13/16: Since last visit. No seizures. Tolerating meds. Still with slightly elevated BP. No PCP yet.  UPDATE 04/22/15: Patient doing well. No seizures since last visit. Tolerating medications. Last seizure was in May 2004 in setting of lack of sleep. In the past tried weaning off depakote x 2 in the past, but had breakthrough seizures.  PRIOR HPI (CM and Hickling): Prior notes reviewed and summarized: Normal birth and development. Born full-term via C-section. Patient had closed head injury at age 36 years old, falling off 5 foot height from staircase onto concrete surface. No family history of seizures. Patient had history of brief absence seizures lasting 10-15 seconds with staring spell and speech arrest around age 36 years old. Patient also had episode of generalized tonic-clonic seizure at a restaurant while eating dinner with his father in April 1998. Patient was started on Depakote in 1998 by Dr. Sharene SkeansHickling.   REVIEW OF SYSTEMS: Full 14 system review of systems performed and negative except: only as per HPI.    ALLERGIES: No Known Allergies  HOME MEDICATIONS: Outpatient Medications Prior to Visit  Medication Sig Dispense Refill  . divalproex (DEPAKOTE ER) 500 MG 24 hr tablet Take 2 tablets (1,000 mg total) by mouth at bedtime. 180 tablet 4   No facility-administered medications prior to visit.     PAST MEDICAL HISTORY: Past Medical History:  Diagnosis Date  . ADD (attention deficit disorder)   . Seizures (HCC)     PAST SURGICAL  HISTORY: Past Surgical History:  Procedure Laterality Date  . PERCUTANEOUS FIXATION TIBIAL SHAFT FRACTURE W/ PINS / SCREWS  2009   clavicle     FAMILY HISTORY: Family History  Problem Relation Age of Onset  . Healthy Mother   . Healthy Father   . Healthy Sister     SOCIAL HISTORY:  Social History   Social History  . Marital status: Single    Spouse name: N/A  . Number of children: 0  . Years of education: College   Occupational History  . Unemployed    Social History Main Topics  . Smoking status: Current Every Day Smoker    Types: Cigars  . Smokeless tobacco: Never Used     Comment: 04/13/16 cigars once a month  . Alcohol use 2.4 oz/week    4 Shots of liquor per week  . Drug use: No  . Sexual activity: Not on file   Other Topics Concern  . Not on file   Social History Narrative   Patient lives at home with with a roommate.    Patient is unemployed.    Patient has a college education.           PHYSICAL EXAM  GENERAL EXAM/CONSTITUTIONAL: Vitals:  Vitals:   04/18/17 1135  BP: (!) 143/89  Pulse: 68  Weight: 253 lb (114.8 kg)  Height: 6\' 1"  (1.854 m)   Body mass index is 33.38 kg/m. No exam data present  Patient is in no distress; well developed, nourished and groomed; neck is supple  CARDIOVASCULAR:  Examination of carotid  arteries is normal; no carotid bruits  Regular rate and rhythm, no murmurs  Examination of peripheral vascular system by observation and palpation is normal  EYES:  Ophthalmoscopic exam of optic discs and posterior segments is normal; no papilledema or hemorrhages  MUSCULOSKELETAL:  Gait, strength, tone, movements noted in Neurologic exam below  NEUROLOGIC: MENTAL STATUS:  No flowsheet data found.  awake, alert, oriented to person, place and time  recent and remote memory intact  normal attention and concentration  language fluent, comprehension intact, naming intact,   fund of knowledge  appropriate  CRANIAL NERVE:   2nd - no papilledema on fundoscopic exam  2nd, 3rd, 4th, 6th - pupils equal and reactive to light, visual fields full to confrontation, extraocular muscles intact, no nystagmus  5th - facial sensation symmetric  7th - facial strength symmetric  8th - hearing intact  9th - palate elevates symmetrically, uvula midline  11th - shoulder shrug symmetric  12th - tongue protrusion midline  MOTOR:   normal bulk and tone, full strength in the BUE, BLE  SENSORY:   normal and symmetric to light touch, temperature, vibration    COORDINATION:   finger-nose-finger, fine finger movements normal  REFLEXES:   deep tendon reflexes present and symmetric  GAIT/STATION:   narrow based gait    DIAGNOSTIC DATA (LABS, IMAGING, TESTING) - I reviewed patient records, labs, notes, testing and imaging myself where available.  Lab Results  Component Value Date   WBC 7.5 04/13/2016   HGB 15.1 04/13/2016   HCT 45.0 04/13/2016   MCV 91 04/13/2016   PLT 267 04/13/2016      Component Value Date/Time   NA 143 04/13/2016 1147   K 4.3 04/13/2016 1147   CL 103 04/13/2016 1147   CO2 24 04/13/2016 1147   GLUCOSE 85 04/13/2016 1147   BUN 16 04/13/2016 1147   CREATININE 0.90 04/13/2016 1147   CALCIUM 9.3 04/13/2016 1147   PROT 7.0 04/13/2016 1147   ALBUMIN 4.2 04/13/2016 1147   AST 14 04/13/2016 1147   ALT 21 04/13/2016 1147   ALKPHOS 74 04/13/2016 1147   BILITOT 0.3 04/13/2016 1147   GFRNONAA 113 04/13/2016 1147   GFRAA 130 04/13/2016 1147   Valproic Acid Lvl  Date Value Ref Range Status  04/09/2014 36 (L) 50 - 100 ug/mL Final    Comment:                                    Detection Limit = 4                            <4 indicates None Detected Toxicity may occur at levels of 100-500. Measurements of free unbound valproic acid may improve the assess- ment of clinical response.  06/01/2013 72 50 - 100 ug/mL Final    Comment:                                     Detection Limit = 4                            <4 indicates None Detected Toxicity may occur at levels of 100-500. Measurements of free unbound valproic acid may improve the assess- ment of clinical response.  No results found for: CHOL, HDL, LDLCALC, LDLDIRECT, TRIG, CHOLHDL No results found for: WUJW1X No results found for: VITAMINB12 No results found for: TSH   09/10/96 EEG - paroxysmal generalized delta range activity that was bilateral and usually generalized 09/10/98 EEG - normal 03/30/02 EEG - normal     ASSESSMENT AND PLAN  36 y.o. year old male here with primary generalized epilepsy, doing well on depakote  BID. Last seizure in May 2004. Failed weaning off meds x 2 in the past. Will plan for lifelong therapy.    Dx:  Generalized idiopathic epilepsy, not intractable, without status epilepticus (HCC)    PLAN:  - continue divalproex ER  (2 tabs at bedtime) - nutrition and physical activity strategies reviewed - recommend patient to establish with PCP to monitor BP; reviewed nutrition, exercise, alcohol and sleep related factors; advised to consider BP and weight monitoring at home  Meds ordered this encounter  Medications  . divalproex (DEPAKOTE ER) 500 MG 24 hr tablet    Sig: Take 2 tablets (1,000 mg total) by mouth at bedtime.    Dispense:  180 tablet    Refill:  4   Orders Placed This Encounter  Procedures  . CBC with Differential/Platelet  . Comprehensive metabolic panel   Return in about 1 year (around 04/18/2018).    Suanne Marker, MD 04/18/2017, 11:46 AM Certified in Neurology, Neurophysiology and Neuroimaging  [facility] [address], [address] [place], [place] [address] [phone]

## 2017-04-19 LAB — COMPREHENSIVE METABOLIC PANEL
A/G RATIO: 1.5 (ref 1.2–2.2)
ALBUMIN: 4.6 g/dL (ref 3.5–5.5)
ALT: 67 IU/L — ABNORMAL HIGH (ref 0–44)
AST: 33 IU/L (ref 0–40)
Alkaline Phosphatase: 77 IU/L (ref 39–117)
BUN/Creatinine Ratio: 15 (ref 9–20)
BUN: 13 mg/dL (ref 6–20)
Bilirubin Total: 0.5 mg/dL (ref 0.0–1.2)
CALCIUM: 9.4 mg/dL (ref 8.7–10.2)
CO2: 24 mmol/L (ref 20–29)
CREATININE: 0.88 mg/dL (ref 0.76–1.27)
Chloride: 101 mmol/L (ref 96–106)
GFR, EST AFRICAN AMERICAN: 130 mL/min/{1.73_m2} (ref >59)
GFR, EST NON AFRICAN AMERICAN: 112 mL/min/{1.73_m2} (ref >59)
GLOBULIN, TOTAL: 3 g/dL (ref 1.5–4.5)
GLUCOSE: 77 mg/dL (ref 65–99)
POTASSIUM: 4.4 mmol/L (ref 3.5–5.2)
SODIUM: 141 mmol/L (ref 134–144)
TOTAL PROTEIN: 7.6 g/dL (ref 6.0–8.5)

## 2017-04-19 LAB — CBC WITH DIFFERENTIAL/PLATELET
Basophils Absolute: 0 10*3/uL (ref 0.0–0.2)
Basos: 1 %
EOS (ABSOLUTE): 0.2 10*3/uL (ref 0.0–0.4)
EOS: 3 %
HEMATOCRIT: 47.3 % (ref 37.5–51.0)
Hemoglobin: 16.2 g/dL (ref 13.0–17.7)
IMMATURE GRANULOCYTES: 0 %
Immature Grans (Abs): 0 10*3/uL (ref 0.0–0.1)
Lymphocytes Absolute: 3.3 10*3/uL — ABNORMAL HIGH (ref 0.7–3.1)
Lymphs: 41 %
MCH: 30.7 pg (ref 26.6–33.0)
MCHC: 34.2 g/dL (ref 31.5–35.7)
MCV: 90 fL (ref 79–97)
MONOCYTES: 7 %
MONOS ABS: 0.5 10*3/uL (ref 0.1–0.9)
NEUTROS PCT: 48 %
Neutrophils Absolute: 3.8 10*3/uL (ref 1.4–7.0)
Platelets: 278 10*3/uL (ref 150–379)
RBC: 5.27 x10E6/uL (ref 4.14–5.80)
RDW: 14.2 % (ref 12.3–15.4)
WBC: 7.9 10*3/uL (ref 3.4–10.8)

## 2017-04-21 ENCOUNTER — Telehealth: Payer: Self-pay | Admitting: *Deleted

## 2017-04-21 NOTE — Telephone Encounter (Signed)
Spoke to pt and relayed lab results unremarkable.  He verbalized understanding.

## 2017-04-21 NOTE — Telephone Encounter (Signed)
-----   Message from Suanne MarkerVikram R Penumalli, MD sent at 04/20/2017  9:00 PM EDT ----- Unremarkable labs. Continue current plan. Please call patient. -VRP

## 2017-06-16 ENCOUNTER — Other Ambulatory Visit: Payer: Self-pay | Admitting: Diagnostic Neuroimaging

## 2018-04-11 ENCOUNTER — Telehealth: Payer: Self-pay | Admitting: Diagnostic Neuroimaging

## 2018-04-11 NOTE — Telephone Encounter (Signed)
Patient called in wanting to r/s his appt but wants an early appt and I didn't have an availably that I could schedule he would like a call back to reschedule is appt. He is aware that his 04/19/18 appt is still on the schedule.

## 2018-04-12 NOTE — Progress Notes (Deleted)
 [facility]  PATIENT: Willie Keller DOB: 12/14/1980   REASON FOR VISIT: Follow-up for seizure disorder  HISTORY FROM:    HISTORY OF PRESENT ILLNESS: UPDATE (04/18/17, VRP): Since last visit, doing well. Tolerating divalproex. No alleviating or aggravating factors. No seizures.  UPDATE 04/13/16: Since last visit. No seizures. Tolerating meds. Still with slightly elevated BP. No PCP yet.  UPDATE 04/22/15: Patient doing well. No seizures since last visit. Tolerating medications. Last seizure was in May 2004 in setting of lack of sleep. In the past tried weaning off depakote x 2 in the past, but had breakthrough seizures.  PRIOR HPI (CM and Hickling): Prior notes reviewed and summarized: Normal birth and development. Born full-term via C-section. Patient had closed head injury at age 6 years old, falling off 5 foot height from staircase onto concrete surface. No family history of seizures. Patient had history of brief absence seizures lasting 10-15 seconds with staring spell and speech arrest around age 15 years old. Patient also had episode of generalized tonic-clonic seizure at a restaurant while eating dinner with his father in April 1998. Patient was started on Depakote in 1998 by Dr. Hickling.   REVIEW OF SYSTEMS: Full 14 system review of systems performed and notable only for those listed, all others are neg:  Constitutional: neg  Cardiovascular: neg Ear/Nose/Throat: neg  Skin: neg Eyes: neg Respiratory: neg Gastroitestinal: neg  Hematology/Lymphatic: neg  Endocrine: neg Musculoskeletal:neg Allergy/Immunology: neg Neurological: neg Psychiatric: neg Sleep : neg   ALLERGIES: No Known Allergies  HOME MEDICATIONS: Outpatient Medications Prior to Visit  Medication Sig Dispense Refill  . divalproex (DEPAKOTE ER) 500 MG 24 hr tablet Take 2 tablets (1,000 mg total) by mouth at bedtime. 180 tablet 4   No facility-administered medications prior to visit.      PAST MEDICAL HISTORY: Past Medical History:  Diagnosis Date  . ADD (attention deficit disorder)   . Seizures (HCC)     PAST SURGICAL HISTORY: Past Surgical History:  Procedure Laterality Date  . PERCUTANEOUS FIXATION TIBIAL SHAFT FRACTURE W/ PINS / SCREWS  2009   clavicle     FAMILY HISTORY: Family History  Problem Relation Age of Onset  . Healthy Mother   . Healthy Father   . Healthy Sister     SOCIAL HISTORY: Social History   Socioeconomic History  . Marital status: Single    Spouse name: Not on file  . Number of children: 0  . Years of education: College  . Highest education level: Not on file  Occupational History  . Occupation: Unemployed  Social Needs  . Financial resource strain: Not on file  . Food insecurity:    Worry: Not on file    Inability: Not on file  . Transportation needs:    Medical: Not on file    Non-medical: Not on file  Tobacco Use  . Smoking status: Current Every Day Smoker    Types: Cigars  . Smokeless tobacco: Never Used  . Tobacco comment: 04/13/16 cigars once a month  Substance and Sexual Activity  . Alcohol use: Yes    Alcohol/week: 4.0 standard drinks    Types: 4 Shots of liquor per week  . Drug use: No  . Sexual activity: Not on file  Lifestyle  . Physical activity:    Days per week: Not on file    Minutes per session: Not on file  . Stress: Not on file  Relationships  . Social connections:    Talks on phone:   Not on file    Gets together: Not on file    Attends religious service: Not on file    Active member of club or organization: Not on file    Attends meetings of clubs or organizations: Not on file    Relationship status: Not on file  . Intimate partner violence:    Fear of current or ex partner: Not on file    Emotionally abused: Not on file    Physically abused: Not on file    Forced sexual activity: Not on file  Other Topics Concern  . Not on file  Social History Narrative   Patient lives at home with  with a roommate.    Patient is unemployed.    Patient has a college education.            PHYSICAL EXAM  Vitals:   04/13/18 1515  BP: 132/80  Pulse: 68  Weight: 250 lb 9.6 oz (113.7 kg)  Height: 6' 1" (1.854 m)   Body mass index is 33.06 kg/m.  Generalized: Well developed, in no acute distress  Head: normocephalic and atraumatic,. Oropharynx benign  Neck: Supple, no carotid bruits  Cardiac: Regular rate rhythm, no murmur  Musculoskeletal: No deformity   Neurological examination   Mentation: Alert oriented to time, place, history taking. Attention span and concentration appropriate. Recent and remote memory intact.  Follows all commands speech and language fluent.   Cranial nerve II-XII: Fundoscopic exam reveals sharp disc margins.Pupils were equal round reactive to light extraocular movements were full, visual field were full on confrontational test. Facial sensation and strength were normal. hearing was intact to finger rubbing bilaterally. Uvula tongue midline. head turning and shoulder shrug were normal and symmetric.Tongue protrusion into cheek strength was normal. Motor: normal bulk and tone, full strength in the BUE, BLE, fine finger movements normal, no pronator drift. No focal weakness Sensory: normal and symmetric to light touch, pinprick, and  Vibration, proprioception  Coordination: finger-nose-finger, heel-to-shin bilaterally, no dysmetria Reflexes: Brachioradialis 2/2, biceps 2/2, triceps 2/2, patellar 2/2, Achilles 2/2, plantar responses were flexor bilaterally. Gait and Station: Rising up from seated position without assistance, normal stance,  moderate stride, good arm swing, smooth turning, able to perform tiptoe, and heel walking without difficulty. Tandem gait is steady  DIAGNOSTIC DATA (LABS, IMAGING, TESTING) - I reviewed patient records, labs, notes, testing and imaging myself where available.  Lab Results  Component Value Date   WBC 7.9 04/18/2017    HGB 16.2 04/18/2017   HCT 47.3 04/18/2017   MCV 90 04/18/2017   PLT 278 04/18/2017      Component Value Date/Time   NA 141 04/18/2017 1426   K 4.4 04/18/2017 1426   CL 101 04/18/2017 1426   CO2 24 04/18/2017 1426   GLUCOSE 77 04/18/2017 1426   BUN 13 04/18/2017 1426   CREATININE 0.88 04/18/2017 1426   CALCIUM 9.4 04/18/2017 1426   PROT 7.6 04/18/2017 1426   ALBUMIN 4.6 04/18/2017 1426   AST 33 04/18/2017 1426   ALT 67 (H) 04/18/2017 1426   ALKPHOS 77 04/18/2017 1426   BILITOT 0.5 04/18/2017 1426   GFRNONAA 112 04/18/2017 1426   GFRAA 130 04/18/2017 1426    ASSESSMENT AND PLAN   36 y.o. year old male here with primary generalized epilepsy, doing well on depakote 500mg BID. Last seizure in May 2004. Failed weaning off meds x 2 in the past. Will plan for lifelong therapy.    Dx:  Generalized idiopathic epilepsy, not   intractable, without status epilepticus (HCC)    PLAN:  - continue divalproex ER 500mg (2 tabs at bedtime) - nutrition and physical activity strategies reviewed - recommend patient to establish with PCP to monitor BP; reviewed nutrition, exercise, alcohol and sleep related factors; advised to consider BP and weight monitoring at home  Continue Depakote 502 tablets at bedtime we will refill CBC CMP today Follow-up yearly and as needed   Lemon Sternberg Carolyn Lamaj Metoyer, GNP, BC, APRN  [facility] [address], [address] [place], [place] [address] [phone] 

## 2018-04-12 NOTE — Telephone Encounter (Signed)
Spoke to pt and he needed appt changed due to child care.  M-Tu early or after 1500 W-F.  I relayed that he could see NP, had seen CM in past.  Has no issues other then needing sz med refills.  R/S to 04-13-18 at 1500.  He verbalized understanding.

## 2018-04-13 ENCOUNTER — Ambulatory Visit: Payer: BLUE CROSS/BLUE SHIELD | Admitting: Nurse Practitioner

## 2018-04-13 ENCOUNTER — Encounter: Payer: Self-pay | Admitting: Nurse Practitioner

## 2018-04-13 VITALS — BP 132/80 | HR 68 | Ht 73.0 in | Wt 250.6 lb

## 2018-04-13 DIAGNOSIS — Z5181 Encounter for therapeutic drug level monitoring: Secondary | ICD-10-CM | POA: Diagnosis not present

## 2018-04-13 DIAGNOSIS — G40309 Generalized idiopathic epilepsy and epileptic syndromes, not intractable, without status epilepticus: Secondary | ICD-10-CM | POA: Diagnosis not present

## 2018-04-13 MED ORDER — DIVALPROEX SODIUM ER 500 MG PO TB24: 1000.0000 mg | ORAL_TABLET | Freq: Every day | ORAL | 3 refills | Status: DC

## 2018-04-13 NOTE — Progress Notes (Signed)
[facility]  PATIENT: Willie Keller DOB: Nov 24, 1980   REASON FOR VISIT: Follow-up for seizure disorder  HISTORY FROM:patient    HISTORY OF PRESENT ILLNESS:UPDATE 9/5/2019CM patient returns for follow-up with seizure disorder.  Continues to tolerate Depakote 500mg  2 tablets at bedtime without side effects.  No seizure activity since May 2004.  Patient has tried to wean Depakote in the past with breakthrough seizures.  Patient needs labs and refills returns for follow-up UPDATE (04/18/17, VRP): Since last visit, doing well. Tolerating divalproex. No alleviating or aggravating factors. No seizures.  UPDATE 04/13/16: Since last visit. No seizures. Tolerating meds. Still with slightly elevated BP. No PCP yet.  UPDATE 04/22/15: Patient doing well. No seizures since last visit. Tolerating medications. Last seizure was in May 2004 in setting of lack of sleep. In the past tried weaning off depakote x 2 in the past, but had breakthrough seizures.  PRIOR HPI (CM and Hickling): Prior notes reviewed and summarized: Normal birth and development. Born full-term via C-section. Patient had closed head injury at age 21 years old, falling off 5 foot height from staircase onto concrete surface. No family history of seizures. Patient had history of brief absence seizures lasting 10-15 seconds with staring spell and speech arrest around age 17 years old. Patient also had episode of generalized tonic-clonic seizure at a restaurant while eating dinner with his father in April 1998. Patient was started on Depakote in 1998 by Dr. Sharene Skeans.   REVIEW OF SYSTEMS: Full 14 system review of systems performed and notable only for those listed, all others are neg:  Constitutional: neg  Cardiovascular: neg Ear/Nose/Throat: neg  Skin: neg Eyes: neg Respiratory: neg Gastroitestinal: neg  Hematology/Lymphatic: neg  Endocrine: neg Musculoskeletal:neg Allergy/Immunology: neg Neurological:  neg Psychiatric: neg Sleep : neg   ALLERGIES: No Known Allergies  HOME MEDICATIONS: Outpatient Medications Prior to Visit  Medication Sig Dispense Refill  . divalproex (DEPAKOTE ER) 500 MG 24 hr tablet Take 2 tablets (1,000 mg total) by mouth at bedtime. 180 tablet 4   No facility-administered medications prior to visit.     PAST MEDICAL HISTORY: Past Medical History:  Diagnosis Date  . ADD (attention deficit disorder)   . Seizures (HCC)     PAST SURGICAL HISTORY: Past Surgical History:  Procedure Laterality Date  . PERCUTANEOUS FIXATION TIBIAL SHAFT FRACTURE W/ PINS / SCREWS  2009   clavicle     FAMILY HISTORY: Family History  Problem Relation Age of Onset  . Healthy Mother   . Healthy Father   . Healthy Sister     SOCIAL HISTORY: Social History   Socioeconomic History  . Marital status: Single    Spouse name: Not on file  . Number of children: 0  . Years of education: College  . Highest education level: Not on file  Occupational History  . Occupation: Unemployed  Social Needs  . Financial resource strain: Not on file  . Food insecurity:    Worry: Not on file    Inability: Not on file  . Transportation needs:    Medical: Not on file    Non-medical: Not on file  Tobacco Use  . Smoking status: Current Every Day Smoker    Types: Cigars  . Smokeless tobacco: Never Used  . Tobacco comment: 04/13/16 cigars once a month  Substance and Sexual Activity  . Alcohol use: Yes    Alcohol/week: 4.0 standard drinks    Types: 4 Shots of liquor per week  . Drug use:  No  . Sexual activity: Not on file  Lifestyle  . Physical activity:    Days per week: Not on file    Minutes per session: Not on file  . Stress: Not on file  Relationships  . Social connections:    Talks on phone: Not on file    Gets together: Not on file    Attends religious service: Not on file    Active member of club or organization: Not on file    Attends meetings of clubs or organizations:  Not on file    Relationship status: Not on file  . Intimate partner violence:    Fear of current or ex partner: Not on file    Emotionally abused: Not on file    Physically abused: Not on file    Forced sexual activity: Not on file  Other Topics Concern  . Not on file  Social History Narrative   Patient lives at home with with a roommate.    Patient is unemployed.    Patient has a college education.            PHYSICAL EXAM  Vitals:   04/13/18 1515  BP: 132/80  Pulse: 68  Weight: 250 lb 9.6 oz (113.7 kg)  Height: 6\' 1"  (1.854 m)   Body mass index is 33.06 kg/m.  Generalized: Well developed, obese male in no acute distress  Head: normocephalic and atraumatic,. Oropharynx benign  Neck: Supple,  Musculoskeletal: No deformity   Neurological examination   Mentation: Alert oriented to time, place, history taking. Attention span and concentration appropriate. Recent and remote memory intact.  Follows all commands speech and language fluent.   Cranial nerve II-XII: Pupils were equal round reactive to light extraocular movements were full, visual field were full on confrontational test. Facial sensation and strength were normal. hearing was intact to finger rubbing bilaterally. Uvula tongue midline. head turning and shoulder shrug were normal and symmetric.Tongue protrusion into cheek strength was normal. Motor: normal bulk and tone, full strength in the BUE, BLE, fine finger movements normal, no pronator drift. No focal weakness Sensory: normal and symmetric to light touch, in the upper and lower extremities Coordination: finger-nose-finger, heel-to-shin bilaterally, no dysmetria Reflexes: Symmetric upper and lower, plantar responses were flexor bilaterally. Gait and Station: Rising up from seated position without assistance, normal stance,  moderate stride, good arm swing, smooth turning, able to perform tiptoe, and heel walking without difficulty. Tandem gait is  steady  DIAGNOSTIC DATA (LABS, IMAGING, TESTING) - I reviewed patient records, labs, notes, testing and imaging myself where available.  Lab Results  Component Value Date   WBC 7.9 04/18/2017   HGB 16.2 04/18/2017   HCT 47.3 04/18/2017   MCV 90 04/18/2017   PLT 278 04/18/2017      Component Value Date/Time   NA 141 04/18/2017 1426   K 4.4 04/18/2017 1426   CL 101 04/18/2017 1426   CO2 24 04/18/2017 1426   GLUCOSE 77 04/18/2017 1426   BUN 13 04/18/2017 1426   CREATININE 0.88 04/18/2017 1426   CALCIUM 9.4 04/18/2017 1426   PROT 7.6 04/18/2017 1426   ALBUMIN 4.6 04/18/2017 1426   AST 33 04/18/2017 1426   ALT 67 (H) 04/18/2017 1426   ALKPHOS 77 04/18/2017 1426   BILITOT 0.5 04/18/2017 1426   GFRNONAA 112 04/18/2017 1426   GFRAA 130 04/18/2017 1426    ASSESSMENT AND PLAN   37 y.o. year old male here with primary generalized epilepsy, doing well on  depakote 500mg  BID. Last seizure in May 2004. Failed weaning off meds x 2 in the past. Will plan for lifelong therapy.      PLAN: Continue Depakote 500mg  2 tablets at bedtime we will refill CBC CMP today Recommend establish with PCP, recommend weight loss, healthy lifestyle exercise and avoidance of alcohol Follow-up yearly and as needed Nilda Riggs, [facility], [facility], APRN  [facility] [address], [address] [place], [place] [address] [phone]

## 2018-04-13 NOTE — Progress Notes (Deleted)
[facility]  PATIENT: Willie Keller DOB: 1980-12-09   REASON FOR VISIT: Follow-up for seizure disorder  HISTORY FROM:    HISTORY OF PRESENT ILLNESS: UPDATE (04/18/17, VRP): Since last visit, doing well. Tolerating divalproex. No alleviating or aggravating factors. No seizures.  UPDATE 04/13/16: Since last visit. No seizures. Tolerating meds. Still with slightly elevated BP. No PCP yet.  UPDATE 04/22/15: Patient doing well. No seizures since last visit. Tolerating medications. Last seizure was in May 2004 in setting of lack of sleep. In the past tried weaning off depakote x 2 in the past, but had breakthrough seizures.  PRIOR HPI (CM and Hickling): Prior notes reviewed and summarized: Normal birth and development. Born full-term via C-section. Patient had closed head injury at age 81 years old, falling off 5 foot height from staircase onto concrete surface. No family history of seizures. Patient had history of brief absence seizures lasting 10-15 seconds with staring spell and speech arrest around age 66 years old. Patient also had episode of generalized tonic-clonic seizure at a restaurant while eating dinner with his father in April 1998. Patient was started on Depakote in 1998 by Dr. Sharene Skeans.   REVIEW OF SYSTEMS: Full 14 system review of systems performed and notable only for those listed, all others are neg:  Constitutional: neg  Cardiovascular: neg Ear/Nose/Throat: neg  Skin: neg Eyes: neg Respiratory: neg Gastroitestinal: neg  Hematology/Lymphatic: neg  Endocrine: neg Musculoskeletal:neg Allergy/Immunology: neg Neurological: neg Psychiatric: neg Sleep : neg   ALLERGIES: No Known Allergies  HOME MEDICATIONS: Outpatient Medications Prior to Visit  Medication Sig Dispense Refill  . divalproex (DEPAKOTE ER) 500 MG 24 hr tablet Take 2 tablets (1,000 mg total) by mouth at bedtime. 180 tablet 4   No facility-administered medications prior to visit.      PAST MEDICAL HISTORY: Past Medical History:  Diagnosis Date  . ADD (attention deficit disorder)   . Seizures (HCC)     PAST SURGICAL HISTORY: Past Surgical History:  Procedure Laterality Date  . PERCUTANEOUS FIXATION TIBIAL SHAFT FRACTURE W/ PINS / SCREWS  2009   clavicle     FAMILY HISTORY: Family History  Problem Relation Age of Onset  . Healthy Mother   . Healthy Father   . Healthy Sister     SOCIAL HISTORY: Social History   Socioeconomic History  . Marital status: Single    Spouse name: Not on file  . Number of children: 0  . Years of education: College  . Highest education level: Not on file  Occupational History  . Occupation: Unemployed  Social Needs  . Financial resource strain: Not on file  . Food insecurity:    Worry: Not on file    Inability: Not on file  . Transportation needs:    Medical: Not on file    Non-medical: Not on file  Tobacco Use  . Smoking status: Current Every Day Smoker    Types: Cigars  . Smokeless tobacco: Never Used  . Tobacco comment: 04/13/16 cigars once a month  Substance and Sexual Activity  . Alcohol use: Yes    Alcohol/week: 4.0 standard drinks    Types: 4 Shots of liquor per week  . Drug use: No  . Sexual activity: Not on file  Lifestyle  . Physical activity:    Days per week: Not on file    Minutes per session: Not on file  . Stress: Not on file  Relationships  . Social connections:    Talks on phone:  Not on file    Gets together: Not on file    Attends religious service: Not on file    Active member of club or organization: Not on file    Attends meetings of clubs or organizations: Not on file    Relationship status: Not on file  . Intimate partner violence:    Fear of current or ex partner: Not on file    Emotionally abused: Not on file    Physically abused: Not on file    Forced sexual activity: Not on file  Other Topics Concern  . Not on file  Social History Narrative   Patient lives at home with  with a roommate.    Patient is unemployed.    Patient has a college education.            PHYSICAL EXAM  Vitals:   04/13/18 1515  BP: 132/80  Pulse: 68  Weight: 250 lb 9.6 oz (113.7 kg)  Height: 6\' 1"  (1.854 m)   Body mass index is 33.06 kg/m.  Generalized: Well developed, in no acute distress  Head: normocephalic and atraumatic,. Oropharynx benign  Neck: Supple, no carotid bruits  Cardiac: Regular rate rhythm, no murmur  Musculoskeletal: No deformity   Neurological examination   Mentation: Alert oriented to time, place, history taking. Attention span and concentration appropriate. Recent and remote memory intact.  Follows all commands speech and language fluent.   Cranial nerve II-XII: Fundoscopic exam reveals sharp disc margins.Pupils were equal round reactive to light extraocular movements were full, visual field were full on confrontational test. Facial sensation and strength were normal. hearing was intact to finger rubbing bilaterally. Uvula tongue midline. head turning and shoulder shrug were normal and symmetric.Tongue protrusion into cheek strength was normal. Motor: normal bulk and tone, full strength in the BUE, BLE, fine finger movements normal, no pronator drift. No focal weakness Sensory: normal and symmetric to light touch, pinprick, and  Vibration, proprioception  Coordination: finger-nose-finger, heel-to-shin bilaterally, no dysmetria Reflexes: Brachioradialis 2/2, biceps 2/2, triceps 2/2, patellar 2/2, Achilles 2/2, plantar responses were flexor bilaterally. Gait and Station: Rising up from seated position without assistance, normal stance,  moderate stride, good arm swing, smooth turning, able to perform tiptoe, and heel walking without difficulty. Tandem gait is steady  DIAGNOSTIC DATA (LABS, IMAGING, TESTING) - I reviewed patient records, labs, notes, testing and imaging myself where available.  Lab Results  Component Value Date   WBC 7.9 04/18/2017    HGB 16.2 04/18/2017   HCT 47.3 04/18/2017   MCV 90 04/18/2017   PLT 278 04/18/2017      Component Value Date/Time   NA 141 04/18/2017 1426   K 4.4 04/18/2017 1426   CL 101 04/18/2017 1426   CO2 24 04/18/2017 1426   GLUCOSE 77 04/18/2017 1426   BUN 13 04/18/2017 1426   CREATININE 0.88 04/18/2017 1426   CALCIUM 9.4 04/18/2017 1426   PROT 7.6 04/18/2017 1426   ALBUMIN 4.6 04/18/2017 1426   AST 33 04/18/2017 1426   ALT 67 (H) 04/18/2017 1426   ALKPHOS 77 04/18/2017 1426   BILITOT 0.5 04/18/2017 1426   GFRNONAA 112 04/18/2017 1426   GFRAA 130 04/18/2017 1426    ASSESSMENT AND PLAN   37 y.o. year old male here with primary generalized epilepsy, doing well on depakote 500mg  BID. Last seizure in May 2004. Failed weaning off meds x 2 in the past. Will plan for lifelong therapy.    Dx:  Generalized idiopathic epilepsy, not  intractable, without status epilepticus (HCC)    PLAN:  - continue divalproex ER 500mg  (2 tabs at bedtime) - nutrition and physical activity strategies reviewed - recommend patient to establish with PCP to monitor BP; reviewed nutrition, exercise, alcohol and sleep related factors; advised to consider BP and weight monitoring at home  Continue Depakote 502 tablets at bedtime we will refill CBC CMP today Follow-up yearly and as needed   Nilda Riggs, [facility], [facility], APRN  [facility] [address] [place], [place] [address] [phone]

## 2018-04-13 NOTE — Progress Notes (Deleted)
 [facility]  PATIENT: Willie Keller DOB: 02/04/1981   REASON FOR VISIT: Follow-up for seizure disorder  HISTORY FROM:patient    HISTORY OF PRESENT ILLNESS:UPDATE 9/5/2019CM patient returns for follow-up with seizure disorder.  Continues to tolerate Depakote 500mg 2 tablets at bedtime without side effects.  No seizure activity since May 2004.  Patient has tried to wean Depakote in the past with breakthrough seizures.  Patient needs labs and refills returns for follow-up UPDATE (04/18/17, VRP): Since last visit, doing well. Tolerating divalproex. No alleviating or aggravating factors. No seizures.  UPDATE 04/13/16: Since last visit. No seizures. Tolerating meds. Still with slightly elevated BP. No PCP yet.  UPDATE 04/22/15: Patient doing well. No seizures since last visit. Tolerating medications. Last seizure was in May 2004 in setting of lack of sleep. In the past tried weaning off depakote x 2 in the past, but had breakthrough seizures.  PRIOR HPI (CM and Hickling): Prior notes reviewed and summarized: Normal birth and development. Born full-term via C-section. Patient had closed head injury at age 6 years old, falling off 5 foot height from staircase onto concrete surface. No family history of seizures. Patient had history of brief absence seizures lasting 10-15 seconds with staring spell and speech arrest around age 15 years old. Patient also had episode of generalized tonic-clonic seizure at a restaurant while eating dinner with his father in April 1998. Patient was started on Depakote in 1998 by Dr. Hickling.   REVIEW OF SYSTEMS: Full 14 system review of systems performed and notable only for those listed, all others are neg:  Constitutional: neg  Cardiovascular: neg Ear/Nose/Throat: neg  Skin: neg Eyes: neg Respiratory: neg Gastroitestinal: neg  Hematology/Lymphatic: neg  Endocrine: neg Musculoskeletal:neg Allergy/Immunology: neg Neurological:  neg Psychiatric: neg Sleep : neg   ALLERGIES: No Known Allergies  HOME MEDICATIONS: Outpatient Medications Prior to Visit  Medication Sig Dispense Refill  . divalproex (DEPAKOTE ER) 500 MG 24 hr tablet Take 2 tablets (1,000 mg total) by mouth at bedtime. 180 tablet 4   No facility-administered medications prior to visit.     PAST MEDICAL HISTORY: Past Medical History:  Diagnosis Date  . ADD (attention deficit disorder)   . Seizures (HCC)     PAST SURGICAL HISTORY: Past Surgical History:  Procedure Laterality Date  . PERCUTANEOUS FIXATION TIBIAL SHAFT FRACTURE W/ PINS / SCREWS  2009   clavicle     FAMILY HISTORY: Family History  Problem Relation Age of Onset  . Healthy Mother   . Healthy Father   . Healthy Sister     SOCIAL HISTORY: Social History   Socioeconomic History  . Marital status: Single    Spouse name: Not on file  . Number of children: 0  . Years of education: College  . Highest education level: Not on file  Occupational History  . Occupation: Unemployed  Social Needs  . Financial resource strain: Not on file  . Food insecurity:    Worry: Not on file    Inability: Not on file  . Transportation needs:    Medical: Not on file    Non-medical: Not on file  Tobacco Use  . Smoking status: Current Every Day Smoker    Types: Cigars  . Smokeless tobacco: Never Used  . Tobacco comment: 04/13/16 cigars once a month  Substance and Sexual Activity  . Alcohol use: Yes    Alcohol/week: 4.0 standard drinks    Types: 4 Shots of liquor per week  . Drug use:   No  . Sexual activity: Not on file  Lifestyle  . Physical activity:    Days per week: Not on file    Minutes per session: Not on file  . Stress: Not on file  Relationships  . Social connections:    Talks on phone: Not on file    Gets together: Not on file    Attends religious service: Not on file    Active member of club or organization: Not on file    Attends meetings of clubs or organizations:  Not on file    Relationship status: Not on file  . Intimate partner violence:    Fear of current or ex partner: Not on file    Emotionally abused: Not on file    Physically abused: Not on file    Forced sexual activity: Not on file  Other Topics Concern  . Not on file  Social History Narrative   Patient lives at home with with a roommate.    Patient is unemployed.    Patient has a college education.            PHYSICAL EXAM  Vitals:   04/13/18 1515  BP: 132/80  Pulse: 68  Weight: 250 lb 9.6 oz (113.7 kg)  Height: 6' 1" (1.854 m)   Body mass index is 33.06 kg/m.  Generalized: Well developed, obese male in no acute distress  Head: normocephalic and atraumatic,. Oropharynx benign  Neck: Supple,  Musculoskeletal: No deformity   Neurological examination   Mentation: Alert oriented to time, place, history taking. Attention span and concentration appropriate. Recent and remote memory intact.  Follows all commands speech and language fluent.   Cranial nerve II-XII: Pupils were equal round reactive to light extraocular movements were full, visual field were full on confrontational test. Facial sensation and strength were normal. hearing was intact to finger rubbing bilaterally. Uvula tongue midline. head turning and shoulder shrug were normal and symmetric.Tongue protrusion into cheek strength was normal. Motor: normal bulk and tone, full strength in the BUE, BLE, fine finger movements normal, no pronator drift. No focal weakness Sensory: normal and symmetric to light touch, in the upper and lower extremities Coordination: finger-nose-finger, heel-to-shin bilaterally, no dysmetria Reflexes: Symmetric upper and lower, plantar responses were flexor bilaterally. Gait and Station: Rising up from seated position without assistance, normal stance,  moderate stride, good arm swing, smooth turning, able to perform tiptoe, and heel walking without difficulty. Tandem gait is  steady  DIAGNOSTIC DATA (LABS, IMAGING, TESTING) - I reviewed patient records, labs, notes, testing and imaging myself where available.  Lab Results  Component Value Date   WBC 7.9 04/18/2017   HGB 16.2 04/18/2017   HCT 47.3 04/18/2017   MCV 90 04/18/2017   PLT 278 04/18/2017      Component Value Date/Time   NA 141 04/18/2017 1426   K 4.4 04/18/2017 1426   CL 101 04/18/2017 1426   CO2 24 04/18/2017 1426   GLUCOSE 77 04/18/2017 1426   BUN 13 04/18/2017 1426   CREATININE 0.88 04/18/2017 1426   CALCIUM 9.4 04/18/2017 1426   PROT 7.6 04/18/2017 1426   ALBUMIN 4.6 04/18/2017 1426   AST 33 04/18/2017 1426   ALT 67 (H) 04/18/2017 1426   ALKPHOS 77 04/18/2017 1426   BILITOT 0.5 04/18/2017 1426   GFRNONAA 112 04/18/2017 1426   GFRAA 130 04/18/2017 1426    ASSESSMENT AND PLAN   36 y.o. year old male here with primary generalized epilepsy, doing well on   depakote 500mg BID. Last seizure in May 2004. Failed weaning off meds x 2 in the past. Will plan for lifelong therapy.      PLAN: Continue Depakote 500mg 2 tablets at bedtime we will refill CBC CMP today Recommend establish with PCP, recommend weight loss, healthy lifestyle exercise and avoidance of alcohol Follow-up yearly and as needed Mckinna Demars Carolyn Ileana Chalupa, GNP, BC, APRN  [facility] [address], [address] [place], [place] [address] [phone]  

## 2018-04-13 NOTE — Patient Instructions (Addendum)
Continue Depakote 500 mg 2 tablets at bedtime we will refill CBC CMP today Follow-up yearly and as needed

## 2018-04-14 LAB — COMPREHENSIVE METABOLIC PANEL
A/G RATIO: 1.5 (ref 1.2–2.2)
ALT: 50 IU/L — AB (ref 0–44)
AST: 35 IU/L (ref 0–40)
Albumin: 4.3 g/dL (ref 3.5–5.5)
Alkaline Phosphatase: 81 IU/L (ref 39–117)
BUN / CREAT RATIO: 15 (ref 9–20)
BUN: 13 mg/dL (ref 6–20)
Bilirubin Total: 0.2 mg/dL (ref 0.0–1.2)
CALCIUM: 9.5 mg/dL (ref 8.7–10.2)
CO2: 20 mmol/L (ref 20–29)
CREATININE: 0.84 mg/dL (ref 0.76–1.27)
Chloride: 105 mmol/L (ref 96–106)
GFR calc non Af Amer: 114 mL/min/{1.73_m2} (ref >59)
GFR, EST AFRICAN AMERICAN: 132 mL/min/{1.73_m2} (ref >59)
GLOBULIN, TOTAL: 2.8 g/dL (ref 1.5–4.5)
Glucose: 88 mg/dL (ref 65–99)
Potassium: 4.6 mmol/L (ref 3.5–5.2)
Sodium: 141 mmol/L (ref 134–144)
Total Protein: 7.1 g/dL (ref 6.0–8.5)

## 2018-04-14 LAB — CBC WITH DIFFERENTIAL/PLATELET
BASOS: 1 %
Basophils Absolute: 0.1 10*3/uL (ref 0.0–0.2)
EOS (ABSOLUTE): 0.5 10*3/uL — ABNORMAL HIGH (ref 0.0–0.4)
EOS: 7 %
HEMATOCRIT: 44 % (ref 37.5–51.0)
HEMOGLOBIN: 14.8 g/dL (ref 13.0–17.7)
IMMATURE GRANS (ABS): 0 10*3/uL (ref 0.0–0.1)
IMMATURE GRANULOCYTES: 0 %
Lymphocytes Absolute: 3 10*3/uL (ref 0.7–3.1)
Lymphs: 42 %
MCH: 30 pg (ref 26.6–33.0)
MCHC: 33.6 g/dL (ref 31.5–35.7)
MCV: 89 fL (ref 79–97)
MONOCYTES: 10 %
Monocytes Absolute: 0.7 10*3/uL (ref 0.1–0.9)
NEUTROS PCT: 40 %
Neutrophils Absolute: 2.9 10*3/uL (ref 1.4–7.0)
Platelets: 277 10*3/uL (ref 150–450)
RBC: 4.93 x10E6/uL (ref 4.14–5.80)
RDW: 13.1 % (ref 12.3–15.4)
WBC: 7.3 10*3/uL (ref 3.4–10.8)

## 2018-04-17 ENCOUNTER — Telehealth: Payer: Self-pay

## 2018-04-17 NOTE — Telephone Encounter (Signed)
-----   Message from Nilda Riggs, NP sent at 04/14/2018  8:52 AM EDT ----- Labs stable please call the patient

## 2018-04-17 NOTE — Telephone Encounter (Signed)
Spoke with the patient and he verbalized understanding his results. No questions at this time.  

## 2018-04-19 ENCOUNTER — Ambulatory Visit: Payer: BLUE CROSS/BLUE SHIELD | Admitting: Diagnostic Neuroimaging

## 2018-04-26 NOTE — Progress Notes (Signed)
I reviewed note and agree with plan.   Suanne MarkerVIKRAM R. PENUMALLI, MD 04/26/2018, 3:22 PM Certified in Neurology, Neurophysiology and Neuroimaging  [facility] [address], [address] [place], [place] [address] [phone]

## 2019-04-24 ENCOUNTER — Ambulatory Visit: Payer: BLUE CROSS/BLUE SHIELD | Admitting: Family Medicine

## 2019-04-24 ENCOUNTER — Ambulatory Visit: Payer: BLUE CROSS/BLUE SHIELD | Admitting: Diagnostic Neuroimaging

## 2019-04-30 ENCOUNTER — Encounter: Payer: Self-pay | Admitting: Family Medicine

## 2019-04-30 ENCOUNTER — Other Ambulatory Visit: Payer: Self-pay

## 2019-04-30 ENCOUNTER — Ambulatory Visit: Payer: BC Managed Care – PPO | Admitting: Family Medicine

## 2019-04-30 VITALS — BP 150/74 | HR 55 | Temp 96.8°F | Ht 73.0 in | Wt 238.6 lb

## 2019-04-30 DIAGNOSIS — R7989 Other specified abnormal findings of blood chemistry: Secondary | ICD-10-CM | POA: Diagnosis not present

## 2019-04-30 DIAGNOSIS — G40309 Generalized idiopathic epilepsy and epileptic syndromes, not intractable, without status epilepticus: Secondary | ICD-10-CM

## 2019-04-30 DIAGNOSIS — R030 Elevated blood-pressure reading, without diagnosis of hypertension: Secondary | ICD-10-CM

## 2019-04-30 DIAGNOSIS — Z23 Encounter for immunization: Secondary | ICD-10-CM | POA: Diagnosis not present

## 2019-04-30 DIAGNOSIS — R6889 Other general symptoms and signs: Secondary | ICD-10-CM | POA: Diagnosis not present

## 2019-04-30 DIAGNOSIS — Z5181 Encounter for therapeutic drug level monitoring: Secondary | ICD-10-CM | POA: Diagnosis not present

## 2019-04-30 DIAGNOSIS — E550 Rickets, active: Secondary | ICD-10-CM | POA: Diagnosis not present

## 2019-04-30 MED ORDER — DIVALPROEX SODIUM ER 500 MG PO TB24: 1000.0000 mg | ORAL_TABLET | Freq: Every day | ORAL | 3 refills | Status: DC

## 2019-04-30 NOTE — Progress Notes (Signed)
PATIENT: Willie Keller DOB: [birth year]  REASON FOR VISIT: follow up HISTORY FROM: patient  Chief Complaint  Patient presents with  . Follow-up    Rm 8, alone.  No seizures  . Seizures     HISTORY OF PRESENT ILLNESS: Today 04/30/19 Willie Keller is a 38 y.o. male here today for follow up for seizure disorder. He continues divalproex 1000mg  at bedtime. He is tolerating medication well with no adverse effects noted. He denies missed doses. Last seizure in 2004. He has not been seen by PCP. He knows that his BP has been elevated. He is married with three children. 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 He has a physically demanding job. He has lost 15 pounds since starting in July. He is taking vitamin D supplements OTC as recommended by previous provider.   HISTORY: (copied from [employer] note on 04/13/2018)  UPDATE 9/5/2019CM patient returns for follow-up with seizure disorder.  Continues to tolerate Depakote 500mg  2 tablets at bedtime without side effects.  No seizure activity since May 2004. Patient has tried to wean Depakote in the past with breakthrough seizures.  Patient needs labs and refills returns for follow-up  UPDATE (04/18/17, VRP): Since last visit, doingwell. Toleratingdivalproex. No alleviating or aggravating factors.No seizures.  UPDATE 04/13/16: Since last visit. No seizures. Tolerating meds. Still with slightly elevated BP. No PCP yet.  UPDATE 04/22/15: Patient doing well. No seizures since last visit. Tolerating medications. Last seizure was in May 2004 in setting of lack of sleep. In the past tried weaning off depakote x 2 in the past, but had breakthrough seizures.  PRIOR HPI (CM and [place]): Prior notes reviewed and summarized: Normal birth and development. Born full-term via C-section. Patient had closed head injury at age 51 years old, falling off 5 foot height from staircase onto concrete surface. No family history of seizures. Patient had history  of brief absence seizures lasting 10-15 seconds with staring spell and speech arrest around age 20 years old. Patient also had episode of generalized tonic-clonic seizure at a restaurant while eating dinner with his father in April 1998. Patient was started on Depakote in 1998 by Dr. Gaynell Face.   REVIEW OF SYSTEMS: Out of a complete 14 system review of symptoms, the patient complains only of the following symptoms, headaches none and all other reviewed systems are negative.   ALLERGIES: No Known Allergies  HOME MEDICATIONS: Outpatient Medications Prior to Visit  Medication Sig Dispense Refill  . divalproex (DEPAKOTE ER) 500 MG 24 hr tablet Take 2 tablets (1,000 mg total) by mouth at bedtime. 180 tablet 3   No facility-administered medications prior to visit.     PAST MEDICAL HISTORY: Past Medical History:  Diagnosis Date  . ADD (attention deficit disorder)   . Seizures ([place])     PAST SURGICAL HISTORY: Past Surgical History:  Procedure Laterality Date  . PERCUTANEOUS FIXATION TIBIAL SHAFT FRACTURE W/ PINS / SCREWS  2009   clavicle     FAMILY HISTORY: Family History  Problem Relation Age of Onset  . Healthy Mother   . Healthy Father   . Healthy Sister     SOCIAL HISTORY: Social History   Socioeconomic History  . Marital status: Single    Spouse name: Not on file  . Number of children: 0  . Years of education: College  . Highest education level: Not on file  Occupational History  . Occupation: Unemployed  Social Needs  . Financial resource strain: Not  on file  . Food insecurity    Worry: Not on file    Inability: Not on file  . Transportation needs    Medical: Not on file    Non-medical: Not on file  Tobacco Use  . Smoking status: Current Some Day Smoker    Types: Cigars  . Smokeless tobacco: Never Used  . Tobacco comment: 04/13/16 cigars once a month  Substance and Sexual Activity  . Alcohol use: Yes    Alcohol/week: 4.0 standard drinks    Types: 4 Shots  of liquor per week  . Drug use: No  . Sexual activity: Not on file  Lifestyle  . Physical activity    Days per week: Not on file    Minutes per session: Not on file  . Stress: Not on file  Relationships  . Social Musician on phone: Not on file    Gets together: Not on file    Attends religious service: Not on file    Active member of club or organization: Not on file    Attends meetings of clubs or organizations: Not on file    Relationship status: Not on file  . Intimate partner violence    Fear of current or ex partner: Not on file    Emotionally abused: Not on file    Physically abused: Not on file    Forced sexual activity: Not on file  Other Topics Concern  . Not on file  Social History Narrative   Patient lives at home with with a roommate.    Patient is unemployed.    Patient has a college education.             PHYSICAL EXAM  Vitals:   04/30/19 0854  BP: (!) 150/74  Pulse: (!) 55  Temp: (!) 96.8 F (36 C)  SpO2: 99%  Weight: 238 lb 9.6 oz (108.2 kg)  Height: 6\' 1"  (1.854 m)   Body mass index is 31.48 kg/m.  Generalized: Well developed, in no acute distress  Cardiology: normal rate and rhythm, no murmur noted Neurological examination  Mentation: Alert oriented to time, place, history taking. Follows all commands speech and language fluent Cranial nerve II-XII: Pupils were equal round reactive to light. Extraocular movements were full, visual field were full on confrontational test. Facial sensation and strength were normal. Uvula tongue midline. Head turning and shoulder shrug  were normal and symmetric. Motor: The motor testing reveals 5 over 5 strength of all 4 extremities. Good symmetric motor tone is noted throughout.  Sensory: Sensory testing is intact to soft touch on all 4 extremities. No evidence of extinction is noted.  Coordination: Cerebellar testing reveals good finger-nose-finger and heel-to-shin bilaterally.  Gait and station:  Gait is normal.   DIAGNOSTIC DATA (LABS, IMAGING, TESTING) - I reviewed patient records, labs, notes, testing and imaging myself where available.  No flowsheet data found.   Lab Results  Component Value Date   WBC 7.3 04/13/2018   HGB 14.8 04/13/2018   HCT 44.0 04/13/2018   MCV 89 04/13/2018   PLT 277 04/13/2018      Component Value Date/Time   NA 141 04/13/2018 1545   K 4.6 04/13/2018 1545   CL 105 04/13/2018 1545   CO2 20 04/13/2018 1545   GLUCOSE 88 04/13/2018 1545   BUN 13 04/13/2018 1545   CREATININE 0.84 04/13/2018 1545   CALCIUM 9.5 04/13/2018 1545   PROT 7.1 04/13/2018 1545   ALBUMIN 4.3 04/13/2018 1545  AST 35 04/13/2018 1545   ALT 50 (H) 04/13/2018 1545   ALKPHOS 81 04/13/2018 1545   BILITOT 0.2 04/13/2018 1545   GFRNONAA 114 04/13/2018 1545   GFRAA 132 04/13/2018 1545   No results found for: CHOL, HDL, LDLCALC, LDLDIRECT, TRIG, CHOLHDL No results found for: ZOXW9U No results found for: VITAMINB12 No results found for: TSH   ASSESSMENT AND PLAN 38 y.o. year old male  has a past medical history of ADD (attention deficit disorder) and Seizures ([facility]). here with     ICD-10-CM   1. Generalized idiopathic epilepsy, not intractable, without status epilepticus (HCC)  G40.309 Valproic Acid Level    CBC with Differential/Platelets    CMP    Vitamin D, 25-hydroxy  2. Elevated BP without diagnosis of hypertension  R03.0     Baby is doing well today.  He is tolerating divalproex acid 1000 mg at bedtime.  We will continue current seizure therapy.  We will update labs today.  We have discussed seizure precautions as well as elevated blood pressures today.  I have recommended that he seek to establish care with a primary care physician as soon as possible.  Would like for him to monitor blood pressures at home as well.  He will follow-up with me in 1 year, sooner if needed.  He verbalizes understanding and agreement with this plan.   Orders Placed This Encounter   Procedures  . Valproic Acid Level  . CBC with Differential/Platelets  . CMP  . Vitamin D, 25-hydroxy     Meds ordered this encounter  Medications  . divalproex (DEPAKOTE ER) 500 MG 24 hr tablet    Sig: Take 2 tablets (1,000 mg total) by mouth at bedtime.    Dispense:  180 tablet    Refill:  3    Order Specific Question:   Supervising Provider    Answer:   Anson Fret [id number]      I spent 15 minutes with the patient. 50% of this time was spent counseling and educating patient on plan of care and medications.    Shawnie Dapper, FNP-C 04/30/2019, 9:31 AM [facility] [address] [place], [place] [address] [phone]

## 2019-04-30 NOTE — Patient Instructions (Addendum)
Continue divalproex 1000mg  nightly  Will update labs today  Please keep an eye on your BP at home  I recommend establishing care with PCP asap for CPE  Follow up with [place] in 1 year    Seizure, Adult A seizure is a sudden burst of abnormal electrical activity in the brain. Seizures usually last from 30 seconds to 2 minutes. They can cause many different symptoms. Usually, seizures are not harmful unless they last a long time. What are the causes? Common causes of this condition include:  Fever or infection.  Conditions that affect the brain, such as: ? A brain abnormality that you were born with. ? A brain or head injury. ? Bleeding in the brain. ? A tumor. ? Stroke. ? Brain disorders such as autism or cerebral palsy.  Low blood sugar.  Conditions that are passed from parent to child (are inherited).  Problems with substances, such as: ? Having a reaction to a drug or a medicine. ? Suddenly stopping the use of a substance (withdrawal). In some cases, the cause may not be known. A person who has repeated seizures over time without a clear cause has a condition called epilepsy. What increases the risk? You are more likely to get this condition if you have:  A family history of epilepsy.  Had a seizure in the past.  A brain disorder.  A history of head injury, lack of oxygen at birth, or strokes. What are the signs or symptoms? There are many types of seizures. The symptoms vary depending on the type of seizure you have. Examples of symptoms during a seizure include:  Shaking (convulsions).  Stiffness in the body.  Passing out (losing consciousness).  Head nodding.  Staring.  Not responding to sound or touch.  Loss of bladder control and bowel control. Some people have symptoms right before and right after a seizure happens. Symptoms before a seizure may include:  Fear.  Worry (anxiety).  Feeling like you may vomit (nauseous).  Feeling like the room is  spinning (vertigo).  Feeling like you saw or heard something before (dj vu).  Odd tastes or smells.  Changes in how you see. You may see flashing lights or spots. Symptoms after a seizure happens can include:  Confusion.  Sleepiness.  Headache.  Weakness on one side of the body. How is this treated? Most seizures will stop on their own in under 5 minutes. In these cases, no treatment is needed. Seizures that last longer than 5 minutes will usually need treatment. Treatment can include:  Medicines given through an IV tube.  Avoiding things that are known to cause your seizures. These can include medicines that you take for another condition.  Medicines to treat epilepsy.  Surgery to stop the seizures. This may be needed if medicines do not help. Follow these instructions at home: Medicines  Take over-the-counter and prescription medicines only as told by your doctor.  Do not eat or drink anything that may keep your medicine from working, such as alcohol. Activity  Do not do any activities that would be dangerous if you had another seizure, like driving or swimming. Wait until your doctor says it is safe for you to do them.  If you live in the [place] ask your local DMV (department of motor vehicles) when you can drive.  Get plenty of rest. Teaching others Teach friends and family what to do when you have a seizure. They should:  Lay you on the ground.  Protect your  head and body.  Loosen any tight clothing around your neck.  Turn you on your side.  Not hold you down.  Not put anything into your mouth.  Know whether or not you need emergency care.  Stay with you until you are better.  General instructions  Contact your doctor each time you have a seizure.  Avoid anything that gives you seizures.  Keep a seizure diary. Write down: ? What you think caused each seizure. ? What you remember about each seizure.  Keep all follow-up visits as told by your  doctor. This is important. Contact a doctor if:  You have another seizure.  You have seizures more often.  There is any change in what happens during your seizures.  You keep having seizures with treatment.  You have symptoms of being sick or having an infection. Get help right away if:  You have a seizure that: ? Lasts longer than 5 minutes. ? Is different than seizures you had before. ? Makes it harder to breathe. ? Happens after you hurt your head.  You have any of these symptoms after a seizure: ? Not being able to speak. ? Not being able to use a part of your body. ? Confusion. ? A bad headache.  You have two or more seizures in a row.  You do not wake up right after a seizure.  You get hurt during a seizure. These symptoms may be an emergency. Do not wait to see if the symptoms will go away. Get medical help right away. Call your local emergency services (911 in the [place] Do not drive yourself to the hospital. Summary  Seizures usually last from 30 seconds to 2 minutes. Usually, they are not harmful unless they last a long time.  Do not eat or drink anything that may keep your medicine from working, such as alcohol.  Teach friends and family what to do when you have a seizure.  Contact your doctor each time you have a seizure. This information is not intended to replace advice given to you by your health care provider. Make sure you discuss any questions you have with your health care provider. Document Released: 01/12/2008 Document Revised: 10/13/2018 Document Reviewed: 10/13/2018 Elsevier Patient Education  2020 [employer].   Hypertension, Adult Hypertension is another name for high blood pressure. High blood pressure forces your heart to work harder to pump blood. This can cause problems over time. There are two numbers in a blood pressure reading. There is a top number (systolic) over a bottom number (diastolic). It is best to have a blood pressure that  is below 120/80. Healthy choices can help lower your blood pressure, or you may need medicine to help lower it. What are the causes? The cause of this condition is not known. Some conditions may be related to high blood pressure. What increases the risk?  Smoking.  Having type 2 diabetes mellitus, high cholesterol, or both.  Not getting enough exercise or physical activity.  Being overweight.  Having too much fat, sugar, calories, or salt (sodium) in your diet.  Drinking too much alcohol.  Having long-term (chronic) kidney disease.  Having a family history of high blood pressure.  Age. Risk increases with age.  Race. You may be at higher risk if you are African American.  Gender. Men are at higher risk than women before age 16. After age 69, women are at higher risk than men.  Having obstructive sleep apnea.  Stress. What are the  signs or symptoms?  High blood pressure may not cause symptoms. Very high blood pressure (hypertensive crisis) may cause: ? Headache. ? Feelings of worry or nervousness (anxiety). ? Shortness of breath. ? Nosebleed. ? A feeling of being sick to your stomach (nausea). ? Throwing up (vomiting). ? Changes in how you see. ? Very bad chest pain. ? Seizures. How is this treated?  This condition is treated by making healthy lifestyle changes, such as: ? Eating healthy foods. ? Exercising more. ? Drinking less alcohol.  Your health care provider may prescribe medicine if lifestyle changes are not enough to get your blood pressure under control, and if: ? Your top number is above 130. ? Your bottom number is above 80.  Your personal target blood pressure may vary. Follow these instructions at home: Eating and drinking   If told, follow the DASH eating plan. To follow this plan: ? Fill one half of your plate at each meal with fruits and vegetables. ? Fill one fourth of your plate at each meal with whole grains. Whole grains include  whole-wheat pasta, brown rice, and whole-grain bread. ? Eat or drink low-fat dairy products, such as skim milk or low-fat yogurt. ? Fill one fourth of your plate at each meal with low-fat (lean) proteins. Low-fat proteins include fish, chicken without skin, eggs, beans, and tofu. ? Avoid fatty meat, cured and processed meat, or chicken with skin. ? Avoid pre-made or processed food.  Eat less than 1,500 mg of salt each day.  Do not drink alcohol if: ? Your doctor tells you not to drink. ? You are pregnant, may be pregnant, or are planning to become pregnant.  If you drink alcohol: ? Limit how much you use to:  0-1 drink a day for women.  0-2 drinks a day for men. ? Be aware of how much alcohol is in your drink. In the [place] one drink equals one 12 oz bottle of beer (355 mL), one 5 oz glass of wine (148 mL), or one 1 oz glass of hard liquor (44 mL). Lifestyle   Work with your doctor to stay at a healthy weight or to lose weight. Ask your doctor what the best weight is for you.  Get at least 30 minutes of exercise most days of the week. This may include walking, swimming, or biking.  Get at least 30 minutes of exercise that strengthens your muscles (resistance exercise) at least 3 days a week. This may include lifting weights or doing Pilates.  Do not use any products that contain nicotine or tobacco, such as cigarettes, e-cigarettes, and chewing tobacco. If you need help quitting, ask your doctor.  Check your blood pressure at home as told by your doctor.  Keep all follow-up visits as told by your doctor. This is important. Medicines  Take over-the-counter and prescription medicines only as told by your doctor. Follow directions carefully.  Do not skip doses of blood pressure medicine. The medicine does not work as well if you skip doses. Skipping doses also puts you at risk for problems.  Ask your doctor about side effects or reactions to medicines that you should watch for.  Contact a doctor if you:  Think you are having a reaction to the medicine you are taking.  Have headaches that keep coming back (recurring).  Feel dizzy.  Have swelling in your ankles.  Have trouble with your vision. Get help right away if you:  Get a very bad headache.  Start to feel  mixed up (confused).  Feel weak or numb.  Feel faint.  Have very bad pain in your: ? Chest. ? Belly (abdomen).  Throw up more than once.  Have trouble breathing. Summary  Hypertension is another name for high blood pressure.  High blood pressure forces your heart to work harder to pump blood.  For most people, a normal blood pressure is less than 120/80.  Making healthy choices can help lower blood pressure. If your blood pressure does not get lower with healthy choices, you may need to take medicine. This information is not intended to replace advice given to you by your health care provider. Make sure you discuss any questions you have with your health care provider. Document Released: 01/12/2008 Document Revised: 04/05/2018 Document Reviewed: 04/05/2018 Elsevier Patient Education  2020 [place] [employer] Calories are units of energy. Your body needs a certain amount of calories from food to keep you going throughout the day. When you eat more calories than your body needs, your body stores the extra calories as fat. When you eat fewer calories than your body needs, your body burns fat to get the energy it needs. Calorie counting means keeping track of how many calories you eat and drink each day. Calorie counting can be helpful if you need to lose weight. If you make sure to eat fewer calories than your body needs, you should lose weight. Ask your health care provider what a healthy weight is for you. For calorie counting to work, you will need to eat the right number of calories in a day in order to lose a healthy amount of weight per week. A dietitian can  help you determine how many calories you need in a day and will give you suggestions on how to reach your calorie goal.  A healthy amount of weight to lose per week is usually 1-2 lb (0.5-0.9 kg). This usually means that your daily calorie intake should be reduced by 500-750 calories.  Eating 1,200 - 1,500 calories per day can help most women lose weight.  Eating 1,500 - 1,800 calories per day can help most men lose weight. What is my plan? My goal is to have __________ calories per day. If I have this many calories per day, I should lose around __________ pounds per week. What do I need to know about calorie counting? In order to meet your daily calorie goal, you will need to:  Find out how many calories are in each food you would like to eat. Try to do this before you eat.  Decide how much of the food you plan to eat.  Write down what you ate and how many calories it had. Doing this is called keeping a food log. To successfully lose weight, it is important to balance calorie counting with a healthy lifestyle that includes regular activity. Aim for 150 minutes of moderate exercise (such as walking) or 75 minutes of vigorous exercise (such as running) each week. Where do I find calorie information?  The number of calories in a food can be found on a Nutrition Facts label. If a food does not have a Nutrition Facts label, try to look up the calories online or ask your dietitian for help. Remember that calories are listed per serving. If you choose to have more than one serving of a food, you will have to multiply the calories per serving by the amount of servings you plan to eat. For example, the  label on a package of bread might say that a serving size is 1 slice and that there are 90 calories in a serving. If you eat 1 slice, you will have eaten 90 calories. If you eat 2 slices, you will have eaten 180 calories. How do I keep a food log? Immediately after each meal, record the following  information in your food log:  What you ate. Don't forget to include toppings, sauces, and other extras on the food.  How much you ate. This can be measured in cups, ounces, or number of items.  How many calories each food and drink had.  The total number of calories in the meal. Keep your food log near you, such as in a small notebook in your pocket, or use a mobile app or website. Some programs will calculate calories for you and show you how many calories you have left for the day to meet your goal. What are some calorie counting tips?   Use your calories on foods and drinks that will fill you up and not leave you hungry: ? Some examples of foods that fill you up are nuts and nut butters, vegetables, lean proteins, and high-fiber foods like whole grains. High-fiber foods are foods with more than 5 g fiber per serving. ? Drinks such as sodas, specialty coffee drinks, alcohol, and juices have a lot of calories, yet do not fill you up.  Eat nutritious foods and avoid empty calories. Empty calories are calories you get from foods or beverages that do not have many vitamins or protein, such as candy, sweets, and soda. It is better to have a nutritious high-calorie food (such as an avocado) than a food with few nutrients (such as a bag of chips).  Know how many calories are in the foods you eat most often. This will help you calculate calorie counts faster.  Pay attention to calories in drinks. Low-calorie drinks include water and unsweetened drinks.  Pay attention to nutrition labels for "low fat" or "fat free" foods. These foods sometimes have the same amount of calories or more calories than the full fat versions. They also often have added sugar, starch, or salt, to make up for flavor that was removed with the fat.  Find a way of tracking calories that works for you. Get creative. Try different apps or programs if writing down calories does not work for you. What are some portion control  tips?  Know how many calories are in a serving. This will help you know how many servings of a certain food you can have.  Use a measuring cup to measure serving sizes. You could also try weighing out portions on a kitchen scale. With time, you will be able to estimate serving sizes for some foods.  Take some time to put servings of different foods on your favorite plates, bowls, and cups so you know what a serving looks like.  Try not to eat straight from a bag or box. Doing this can lead to overeating. Put the amount you would like to eat in a cup or on a plate to make sure you are eating the right portion.  Use smaller plates, glasses, and bowls to prevent overeating.  Try not to multitask (for example, watch TV or use your computer) while eating. If it is time to eat, sit down at a table and enjoy your food. This will help you to know when you are full. It will also help you to be aware  of what you are eating and how much you are eating. What are tips for following this plan? Reading food labels  Check the calorie count compared to the serving size. The serving size may be smaller than what you are used to eating.  Check the source of the calories. Make sure the food you are eating is high in vitamins and protein and low in saturated and trans fats. Shopping  Read nutrition labels while you shop. This will help you make healthy decisions before you decide to purchase your food.  Make a grocery list and stick to it. Cooking  Try to cook your favorite foods in a healthier way. For example, try baking instead of frying.  Use low-fat dairy products. Meal planning  Use more fruits and vegetables. Half of your plate should be fruits and vegetables.  Include lean proteins like poultry and fish. How do I count calories when eating out?  Ask for smaller portion sizes.  Consider sharing an entree and sides instead of getting your own entree.  If you get your own entree, eat only  half. Ask for a box at the beginning of your meal and put the rest of your entree in it so you are not tempted to eat it.  If calories are listed on the menu, choose the lower calorie options.  Choose dishes that include vegetables, fruits, whole grains, low-fat dairy products, and lean protein.  Choose items that are boiled, broiled, grilled, or steamed. Stay away from items that are buttered, battered, fried, or served with cream sauce. Items labeled "crispy" are usually fried, unless stated otherwise.  Choose water, low-fat milk, unsweetened iced tea, or other drinks without added sugar. If you want an alcoholic beverage, choose a lower calorie option such as a glass of wine or light beer.  Ask for dressings, sauces, and syrups on the side. These are usually high in calories, so you should limit the amount you eat.  If you want a salad, choose a garden salad and ask for grilled meats. Avoid extra toppings like bacon, cheese, or fried items. Ask for the dressing on the side, or ask for olive oil and vinegar or lemon to use as dressing.  Estimate how many servings of a food you are given. For example, a serving of cooked rice is  cup or about the size of half a baseball. Knowing serving sizes will help you be aware of how much food you are eating at restaurants. The list below tells you how big or small some common portion sizes are based on everyday objects: ? 1 oz-4 stacked dice. ? 3 oz-1 deck of cards. ? 1 tsp-1 die. ? 1 Tbsp- a ping-pong ball. ? 2 Tbsp-1 ping-pong ball. ?  cup- baseball. ? 1 cup-1 baseball. Summary  Calorie counting means keeping track of how many calories you eat and drink each day. If you eat fewer calories than your body needs, you should lose weight.  A healthy amount of weight to lose per week is usually 1-2 lb (0.5-0.9 kg). This usually means reducing your daily calorie intake by 500-750 calories.  The number of calories in a food can be found on a  Nutrition Facts label. If a food does not have a Nutrition Facts label, try to look up the calories online or ask your dietitian for help.  Use your calories on foods and drinks that will fill you up, and not on foods and drinks that will leave you hungry.  Use  smaller plates, glasses, and bowls to prevent overeating. This information is not intended to replace advice given to you by your health care provider. Make sure you discuss any questions you have with your health care provider. Document Released: 07/26/2005 Document Revised: 04/14/2018 Document Reviewed: 06/25/2016 Elsevier Patient Education  2020 [employer].

## 2019-05-01 ENCOUNTER — Telehealth: Payer: Self-pay | Admitting: *Deleted

## 2019-05-01 LAB — CBC WITH DIFFERENTIAL/PLATELET
Basophils Absolute: 0.1 10*3/uL (ref 0.0–0.2)
Basos: 1 %
EOS (ABSOLUTE): 0.3 10*3/uL (ref 0.0–0.4)
Eos: 4 %
Hematocrit: 45.4 % (ref 37.5–51.0)
Hemoglobin: 15.5 g/dL (ref 13.0–17.7)
Immature Grans (Abs): 0 10*3/uL (ref 0.0–0.1)
Immature Granulocytes: 0 %
Lymphocytes Absolute: 2.2 10*3/uL (ref 0.7–3.1)
Lymphs: 31 %
MCH: 31.2 pg (ref 26.6–33.0)
MCHC: 34.1 g/dL (ref 31.5–35.7)
MCV: 91 fL (ref 79–97)
Monocytes Absolute: 0.7 10*3/uL (ref 0.1–0.9)
Monocytes: 9 %
Neutrophils Absolute: 4 10*3/uL (ref 1.4–7.0)
Neutrophils: 55 %
Platelets: 235 10*3/uL (ref 150–450)
RBC: 4.97 x10E6/uL (ref 4.14–5.80)
RDW: 13.3 % (ref 11.6–15.4)
WBC: 7.3 10*3/uL (ref 3.4–10.8)

## 2019-05-01 LAB — COMPREHENSIVE METABOLIC PANEL
ALT: 28 IU/L (ref 0–44)
AST: 24 IU/L (ref 0–40)
Albumin/Globulin Ratio: 1.8 (ref 1.2–2.2)
Albumin: 4.5 g/dL (ref 4.0–5.0)
Alkaline Phosphatase: 87 IU/L (ref 39–117)
BUN/Creatinine Ratio: 20 (ref 9–20)
BUN: 18 mg/dL (ref 6–20)
Bilirubin Total: 0.3 mg/dL (ref 0.0–1.2)
CO2: 23 mmol/L (ref 20–29)
Calcium: 9.9 mg/dL (ref 8.7–10.2)
Chloride: 102 mmol/L (ref 96–106)
Creatinine, Ser: 0.9 mg/dL (ref 0.76–1.27)
GFR calc Af Amer: 127 mL/min/{1.73_m2} (ref >59)
GFR calc non Af Amer: 109 mL/min/{1.73_m2} (ref >59)
Globulin, Total: 2.5 g/dL (ref 1.5–4.5)
Glucose: 71 mg/dL (ref 65–99)
Potassium: 4.2 mmol/L (ref 3.5–5.2)
Sodium: 143 mmol/L (ref 134–144)
Total Protein: 7 g/dL (ref 6.0–8.5)

## 2019-05-01 LAB — VALPROIC ACID LEVEL: Valproic Acid Lvl: 79 ug/mL (ref 50–100)

## 2019-05-01 LAB — VITAMIN D 25 HYDROXY (VIT D DEFICIENCY, FRACTURES): Vit D, 25-Hydroxy: 25.8 ng/mL — ABNORMAL LOW (ref 30.0–100.0)

## 2019-05-01 NOTE — Telephone Encounter (Signed)
Spoke with patient and informed him his labs look good with the exception of vitamin D. Advised the NP  thinks he would benefit from daily supplements. She would like for him to increase his daily dose of vitamin D to 5000 iu. Advised he can get this OTC. RI reminded him to establish care with PCP asap. He  verbalized understanding, appreciation.

## 2019-05-07 NOTE — Progress Notes (Signed)
I reviewed note and agree with plan.   Penni Bombard, MD 6/60/6004, 5:99 PM Certified in Neurology, Neurophysiology and Neuroimaging  [facility] [address] [place], [place] [address] [phone]

## 2019-06-07 ENCOUNTER — Telehealth: Payer: Self-pay

## 2019-06-07 NOTE — Telephone Encounter (Signed)
[facility] ICD 10 code form has been filled out and signed by Debbora Presto, NP. Faxed back to [facility]. Confirmation fax has been received.

## 2019-06-11 DIAGNOSIS — L659 Nonscarring hair loss, unspecified: Secondary | ICD-10-CM | POA: Diagnosis not present

## 2019-06-11 DIAGNOSIS — R0683 Snoring: Secondary | ICD-10-CM | POA: Diagnosis not present

## 2019-06-11 DIAGNOSIS — E669 Obesity, unspecified: Secondary | ICD-10-CM | POA: Diagnosis not present

## 2019-06-11 DIAGNOSIS — Z1331 Encounter for screening for depression: Secondary | ICD-10-CM | POA: Diagnosis not present

## 2019-06-11 DIAGNOSIS — M722 Plantar fascial fibromatosis: Secondary | ICD-10-CM | POA: Diagnosis not present

## 2019-07-17 DIAGNOSIS — E559 Vitamin D deficiency, unspecified: Secondary | ICD-10-CM | POA: Diagnosis not present

## 2019-07-17 DIAGNOSIS — I10 Essential (primary) hypertension: Secondary | ICD-10-CM | POA: Diagnosis not present

## 2019-07-17 DIAGNOSIS — R030 Elevated blood-pressure reading, without diagnosis of hypertension: Secondary | ICD-10-CM | POA: Diagnosis not present

## 2019-07-17 DIAGNOSIS — E669 Obesity, unspecified: Secondary | ICD-10-CM | POA: Diagnosis not present

## 2020-01-04 ENCOUNTER — Ambulatory Visit (HOSPITAL_COMMUNITY)
Admission: EM | Admit: 2020-01-04 | Discharge: 2020-01-04 | Disposition: A | Discharge status: Home / Self Care | Payer: 59

## 2020-01-04 ENCOUNTER — Encounter (HOSPITAL_COMMUNITY): Payer: Self-pay

## 2020-01-04 ENCOUNTER — Other Ambulatory Visit: Payer: Self-pay

## 2020-01-04 DIAGNOSIS — R0781 Pleurodynia: Secondary | ICD-10-CM

## 2020-01-04 DIAGNOSIS — T148XXA Other injury of unspecified body region, initial encounter: Secondary | ICD-10-CM

## 2020-01-04 DIAGNOSIS — R0602 Shortness of breath: Secondary | ICD-10-CM | POA: Diagnosis not present

## 2020-01-04 HISTORY — DX: Essential (primary) hypertension: I10

## 2020-01-04 MED ORDER — DEXAMETHASONE SODIUM PHOSPHATE 10 MG/ML IJ SOLN
10.0000 mg | Freq: Once | INTRAMUSCULAR | Status: AC
  Administered 2020-01-04: 10 mg via INTRAMUSCULAR

## 2020-01-04 MED ORDER — DEXAMETHASONE SODIUM PHOSPHATE 10 MG/ML IJ SOLN
INTRAMUSCULAR | Status: AC
  Filled 2020-01-04: qty 1

## 2020-01-04 NOTE — ED Triage Notes (Addendum)
Pt c/o acute onset SOB and right rib pain while at work after picking up 100lb box and lifting into carrier. Pt c/o increase SOB with movement/inspiration and pain has radiated to right neck. Denies dizziness, lightheadedness or CP, no nausea, diaphoresis.  Tylenol at 1500 Pt with SOBOE. Speaks full sentences with tachypnea noted, slightly diminished lung sounds right lobe, otherwise CTA.

## 2020-01-04 NOTE — Discharge Instructions (Addendum)
It sounds as though you have a pulled muscle from lifting overhead  Take the muscle relaxers that you have at home  If you are not feeling better by Monday, follow up with this office or with primary care

## 2020-01-05 NOTE — ED Provider Notes (Signed)
[place]   [id number] 01/04/20 Arrival Time: 1801  QM:GQQPY PAIN  SUBJECTIVE: History from: patient. MORTIMER BAIR is a 39 y.o. male complains of right sided rib pain that began at work last night. He works at [employer] and lifts heavy boxes at night. Reports that he felt a pull to his right side and neck while at work last night. Reports SOB, due to pain down his back on the R side and R sided rib pain with lung expansion. Describes the pain as constant and achy in character. Has tried OTC medications without relief. Symptoms are made worse with activity. Denies similar symptoms in the past. Denies fever, chills, erythema, ecchymosis, effusion, weakness, numbness and tingling, saddle paresthesias, loss of bowel or bladder function.      ROS: As per HPI.  All other pertinent ROS negative.     Past Medical History:  Diagnosis Date  . ADD (attention deficit disorder)   . Hypertension   . Seizures ([place])    Past Surgical History:  Procedure Laterality Date  . PERCUTANEOUS FIXATION TIBIAL SHAFT FRACTURE W/ PINS / SCREWS  2009   clavicle    No Known Allergies No current facility-administered medications on file prior to encounter.   Current Outpatient Medications on File Prior to Encounter  Medication Sig Dispense Refill  . amLODipine (NORVASC) 10 MG tablet TAKE 1 TABLET BY MOUTH DAILY. KEEP TAKING BLOOD PRESSURE. GET LABWORK DONE    . divalproex (DEPAKOTE ER) 500 MG 24 hr tablet Take 2 tablets (1,000 mg total) by mouth at bedtime. 180 tablet 3  . omeprazole (PRILOSEC) 20 MG capsule Take 20 mg by mouth daily.     Social History   Socioeconomic History  . Marital status: Single    Spouse name: Not on file  . Number of children: 0  . Years of education: College  . Highest education level: Not on file  Occupational History  . Occupation: Unemployed  Tobacco Use  . Smoking status: Current Some Day Smoker    Types: Cigars  . Smokeless tobacco: Never Used  . Tobacco  comment: 04/13/16 cigars once a month  Substance and Sexual Activity  . Alcohol use: Yes    Alcohol/week: 4.0 standard drinks    Types: 4 Shots of liquor per week  . Drug use: No  . Sexual activity: Not on file  Other Topics Concern  . Not on file  Social History Narrative   Patient lives at home with with a roommate.    Patient is unemployed.    Patient has a college education.          Social Determinants of Health   Financial Resource Strain:   . Difficulty of Paying Living Expenses:   Food Insecurity:   . Worried About Charity fundraiser in the Last Year:   . Arboriculturist in the Last Year:   Transportation Needs:   . Film/video editor (Medical):   Marland Kitchen Lack of Transportation (Non-Medical):   Physical Activity:   . Days of Exercise per Week:   . Minutes of Exercise per Session:   Stress:   . Feeling of Stress :   Social Connections:   . Frequency of Communication with Friends and Family:   . Frequency of Social Gatherings with Friends and Family:   . Attends Religious Services:   . Active Member of Clubs or Organizations:   . Attends Archivist Meetings:   Marland Kitchen Marital Status:  Intimate Partner Violence:   . Fear of Current or Ex-Partner:   . Emotionally Abused:   Marland Kitchen Physically Abused:   . Sexually Abused:    Family History  Problem Relation Age of Onset  . Healthy Mother   . Healthy Father   . Healthy Sister     OBJECTIVE:  Vitals:   01/04/20 1847 01/04/20 1851  BP: (!) 144/89   Pulse: 71   Resp: (!) 24   Temp: 98.9 F (37.2 C)   TempSrc: Oral   SpO2: 100% 100%    General appearance: ALERT; in no acute distress.  Head: NCAT Lungs: tachypnea, unlabored respiration, lungs CTAB CV: No peripheral edema,. Cap refill < 2 seconds Musculoskeletal:  Inspection: Skin warm, dry, clear and intact without obvious erythema, effusion, or ecchymosis.  Palpation: Nontender to palpation along neck, back and ribs ROM: FROM active and passive Skin:  warm and dry Neurologic: Ambulates without difficulty; Sensation intact about the upper/ lower extremities Psychological: alert and cooperative; normal mood and affect  DIAGNOSTIC STUDIES:  No results found.   ASSESSMENT & PLAN:  1. SOB (shortness of breath)   2. Rib pain on right side   3. Pulled muscle     Meds ordered this encounter  Medications  . dexamethasone (DECADRON) injection 10 mg    Continue conservative management of rest, ice, and gentle stretches Take ibuprofen as needed for pain relief (may cause abdominal discomfort, ulcers, and GI bleeds avoid taking with other NSAIDs)  Follow up with PCP if symptoms persist Return or go to the ER if you have any new or worsening symptoms (fever, chills, chest pain, abdominal pain, changes in bowel or bladder habits, pain radiating into lower legs)   Reviewed expectations re: course of current medical issues. Questions answered. Outlined signs and symptoms indicating need for more acute intervention. Patient verbalized understanding. After Visit Summary given.       Moshe Cipro, NP 01/05/20 2052

## 2020-04-10 ENCOUNTER — Other Ambulatory Visit: Payer: Self-pay

## 2020-04-10 ENCOUNTER — Encounter (HOSPITAL_COMMUNITY): Payer: Self-pay

## 2020-04-10 ENCOUNTER — Ambulatory Visit (HOSPITAL_COMMUNITY)
Admission: EM | Admit: 2020-04-10 | Discharge: 2020-04-10 | Disposition: A | Discharge status: Home / Self Care | Payer: 59 | Attending: Family Medicine | Admitting: Family Medicine

## 2020-04-10 DIAGNOSIS — R0981 Nasal congestion: Secondary | ICD-10-CM | POA: Diagnosis not present

## 2020-04-10 DIAGNOSIS — Z20822 Contact with and (suspected) exposure to covid-19: Secondary | ICD-10-CM | POA: Insufficient documentation

## 2020-04-10 DIAGNOSIS — Z560 Unemployment, unspecified: Secondary | ICD-10-CM | POA: Diagnosis not present

## 2020-04-10 DIAGNOSIS — I10 Essential (primary) hypertension: Secondary | ICD-10-CM | POA: Diagnosis not present

## 2020-04-10 DIAGNOSIS — M62830 Muscle spasm of back: Secondary | ICD-10-CM | POA: Insufficient documentation

## 2020-04-10 LAB — SARS CORONAVIRUS 2 (TAT 6-24 HRS): SARS Coronavirus 2: NEGATIVE

## 2020-04-10 NOTE — ED Provider Notes (Signed)
[facility]   [id number] 04/10/20 Arrival Time: 1043  ASSESSMENT & PLAN:  1. Muscle spasm of back   2. Nasal congestion     COVID testing sent at his request. OTC symptom care. Continue muscle relaxer he has at home. Ibuprofen if needed. Able to ambulate here and hemodynamically stable. No indication for imaging of back at this time given no trauma and normal neurological exam. Discussed.  Work note provided.   Recommend:  Follow-up Information    [facility].   Why: If worsening or failing to improve as anticipated. Contact information: [address] [place] [address] [phone]              Reviewed expectations re: course of current medical issues. Questions answered. Outlined signs and symptoms indicating need for more acute intervention. Patient verbalized understanding. After Visit Summary given.   SUBJECTIVE: History from: patient.  Willie Keller is a 39 y.o. male who presents with complaint of fairly persistent mid back pain; h/o muscle spasms with similar symptoms. First noted yesterday after heavy lifting at work. No back trauma. No specific aggravating or alleviating factors reported. Ambulatory without difficulty. No extremity sensation changes or weakness. Also requesting COVID testing. Mild nasal congestion for a few days. Afebrile.   OBJECTIVE:  Vitals:   04/10/20 1140  BP: 128/81  Pulse: 77  Resp: 19  Temp: 98 F (36.7 C)  SpO2: 100%    General appearance: alert; no distress HEENT: [place]; AT Lungs: unlabored respirations; speaks full sentences without difficulty Abdomen: soft, non-tender; non-distended Back: reports tenderness over mid back paraspinal musculature; FROM at waist; without specific midline tenderness Extremities: without edema; symmetrical without gross deformities; normal ROM of bilateral LE Skin: warm and dry Neurologic: normal gait Psychological: alert and  cooperative; normal mood and affect  Labs:  Labs Reviewed  SARS CORONAVIRUS 2 (TAT 6-24 HRS)      No Known Allergies  Past Medical History:  Diagnosis Date   ADD (attention deficit disorder)    Hypertension    Seizures ([facility])    Social History   Socioeconomic History   Marital status: Single    Spouse name: Not on file   Number of children: 0   Years of education: College   Highest education level: Not on file  Occupational History   Occupation: Unemployed  Tobacco Use   Smoking status: Current Some Day Smoker    Types: Cigars   Smokeless tobacco: Never Used   Tobacco comment: 04/13/16 cigars once a month  Substance and Sexual Activity   Alcohol use: Yes    Alcohol/week: 4.0 standard drinks    Types: 4 Shots of liquor per week   Drug use: No   Sexual activity: Not on file  Other Topics Concern   Not on file  Social History Narrative   Patient lives at home with with a roommate.    Patient is unemployed.    Patient has a college education.          Social Determinants of Health   Financial Resource Strain:    Difficulty of Paying Living Expenses: Not on file  Food Insecurity:    Worried About Programme researcher, broadcasting/film/video in the Last Year: Not on file   [employer] of Food in the Last Year: Not on file  Transportation Needs:    Lack of Transportation (Medical): Not on file   Lack of Transportation (Non-Medical): Not on file  Physical  Activity:    Days of Exercise per Week: Not on file   Minutes of Exercise per Session: Not on file  Stress:    Feeling of Stress : Not on file  Social Connections:    Frequency of Communication with Friends and Family: Not on file   Frequency of Social Gatherings with Friends and Family: Not on file   Attends Religious Services: Not on file   Active Member of Clubs or Organizations: Not on file   Attends Banker Meetings: Not on file   Marital Status: Not on file  Intimate Partner Violence:      Fear of Current or Ex-Partner: Not on file   Emotionally Abused: Not on file   Physically Abused: Not on file   Sexually Abused: Not on file   Family History  Problem Relation Age of Onset   Healthy Mother    Healthy Father    Healthy Sister    Past Surgical History:  Procedure Laterality Date   PERCUTANEOUS FIXATION TIBIAL SHAFT FRACTURE W/ PINS / SCREWS  2009   clavicle      Mardella Layman, MD 04/10/20 1258

## 2020-04-10 NOTE — ED Triage Notes (Signed)
Pt presents with mid back pain. 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 Reports the pain is constant but worse with movement. Reports the area is tight. Reports taking a muscle relaxer last night with some improvement in movement.   Pt is requesting covid testing, denies any symptoms.

## 2020-04-10 NOTE — Discharge Instructions (Addendum)
You have been tested for COVID-19 today. °If your test returns positive, you will receive a phone call from [facility] regarding your results. °Negative test results are not called. °Both positive and negative results area always visible on MyChart. °If you do not have a MyChart account, sign up instructions are provided in your discharge papers. °Please do not hesitate to contact us should you have questions or concerns. ° °

## 2020-05-01 NOTE — Progress Notes (Signed)
PATIENT: Willie Keller DOB: 1981/05/11  REASON FOR VISIT: follow up HISTORY FROM: patient  Chief Complaint  Patient presents with  . Follow-up    1y f/u for seizures. Denies any new seizures.   . room 1    alone      HISTORY OF PRESENT ILLNESS: Today 05/05/20 Willie Keller is a 39 y.o. male here today for follow up for seizures. He continues divalproex ER 1000mg  at bedtime. No seizures, last in 2004. He is now taking amlodipine 10mg  for HTN. He is also taking vitamin D supplements. He is feeling well today and without concerns.   HISTORY: (copied from my note on 04/30/2019)  Willie Keller is a 39 y.o. male here today for follow up for seizure disorder. He continues divalproex 1000mg  at bedtime. He is tolerating medication well with no adverse effects noted. He denies missed doses. Last seizure in 2004. He has not been seen by PCP. He knows that his BP has been elevated. He is married with three children. 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 He has a physically demanding job. He has lost 15 pounds since starting in July. He is taking vitamin D supplements OTC as recommended by previous provider.   HISTORY: (copied from 2005 note on 04/13/2018)  UPDATE 9/5/2019CMpatient returns for follow-up with seizure disorder. Continues to tolerate Depakote 500mg  2 tablets at bedtime without side effects. No seizure activity since May 2004. Patient has tried to wean Depakote in the past with breakthrough seizures. Patient needs labs and refills returns for follow-up  UPDATE (04/18/17, [facility]P): Since last visit, doingwell. Toleratingdivalproex. No alleviating or aggravating factors.No seizures.  UPDATE 04/13/16: Since last visit. No seizures. Tolerating meds. Still with slightly elevated BP. No PCP yet.  UPDATE 04/22/15: Patient doing well. No seizures since last visit. Tolerating medications. Last seizure was in May 2004 in setting of lack of sleep. In the past tried  weaning off depakote x 2 in the past, but had breakthrough seizures.  PRIOR HPI (Willie Keller and Willie Keller): Prior notes reviewed and summarized: Normal birth and development. Born full-term via C-section. Patient had closed head injury at age 78 years old, falling off 5 foot height from staircase onto concrete surface. No family history of seizures. Patient had history of brief absence seizures lasting 10-15 seconds with staring spell and speech arrest around age 60 years old. Patient also had episode of generalized tonic-clonic seizure at a restaurant while eating dinner with his father in April 1998. Patient was started on Depakote in 1998 by Dr. 10.   REVIEW OF SYSTEMS: Out of a complete 14 system review of symptoms, the patient complains only of the following symptoms, none and all other reviewed systems are negative.  ALLERGIES: No Known Allergies  HOME MEDICATIONS: Outpatient Medications Prior to Visit  Medication Sig Dispense Refill  . amLODipine (NORVASC) 10 MG tablet TAKE 1 TABLET BY MOUTH DAILY. KEEP TAKING BLOOD PRESSURE. GET LABWORK DONE    . omeprazole (PRILOSEC) 20 MG capsule Take 20 mg by mouth daily.    . divalproex (DEPAKOTE ER) 500 MG 24 hr tablet Take 2 tablets (1,000 mg total) by mouth at bedtime. 180 tablet 3   No facility-administered medications prior to visit.    PAST MEDICAL HISTORY: Past Medical History:  Diagnosis Date  . ADD (attention deficit disorder)   . Hypertension   . Seizures ([facility])     PAST SURGICAL HISTORY: Past Surgical History:  Procedure Laterality Date  .  PERCUTANEOUS FIXATION TIBIAL SHAFT FRACTURE W/ PINS / SCREWS  2009   clavicle     FAMILY HISTORY: Family History  Problem Relation Age of Onset  . Healthy Mother   . Healthy Father   . Healthy Sister     SOCIAL HISTORY: Social History   Socioeconomic History  . Marital status: Single    Spouse name: Not on file  . Number of children: 0  . Years of education: College  . Highest  education level: Not on file  Occupational History  . Occupation: Unemployed  Tobacco Use  . Smoking status: Current Some Day Smoker    Types: Cigars  . Smokeless tobacco: Never Used  . Tobacco comment: 04/13/16 cigars once a month  Substance and Sexual Activity  . Alcohol use: Yes    Alcohol/week: 4.0 standard drinks    Types: 4 Shots of liquor per week  . Drug use: No  . Sexual activity: Not on file  Other Topics Concern  . Not on file  Social History Narrative   Patient lives at home with with a roommate.    Patient is unemployed.    Patient has a college education.          Social Determinants of Health   Financial Resource Strain:   . Difficulty of Paying Living Expenses: Not on file  Food Insecurity:   . Worried About Programme researcher, broadcasting/film/video in the Last Year: Not on file  . Ran Out of Food in the Last Year: Not on file  Transportation Needs:   . Lack of Transportation (Medical): Not on file  . Lack of Transportation (Non-Medical): Not on file  Physical Activity:   . Days of Exercise per Week: Not on file  . Minutes of Exercise per Session: Not on file  Stress:   . Feeling of Stress : Not on file  Social Connections:   . Frequency of Communication with Friends and Family: Not on file  . Frequency of Social Gatherings with Friends and Family: Not on file  . Attends Religious Services: Not on file  . Active Member of Clubs or Organizations: Not on file  . Attends Banker Meetings: Not on file  . Marital Status: Not on file  Intimate Partner Violence:   . Fear of Current or Ex-Partner: Not on file  . Emotionally Abused: Not on file  . Physically Abused: Not on file  . Sexually Abused: Not on file      PHYSICAL EXAM  Vitals:   05/05/20 0911  BP: (!) 138/93  Pulse: 82  Weight: 230 lb (104.3 kg)  Height: 6\' 1"  (1.854 m)   Body mass index is 30.34 kg/m.  Generalized: Well developed, in no acute distress  Cardiology: normal rate and rhythm, no  murmur noted Respiratory: clear to auscultation bilaterally  Neurological examination  Mentation: Alert oriented to time, place, history taking. Follows all commands speech and language fluent Cranial nerve II-XII: Pupils were equal round reactive to light. Extraocular movements were full, visual field were full on confrontational test. Facial sensation and strength were normal. Head turning and shoulder shrug  were normal and symmetric. Motor: The motor testing reveals 5 over 5 strength of all 4 extremities. Good symmetric motor tone is noted throughout.  Sensory: Sensory testing is intact to soft touch on all 4 extremities. No evidence of extinction is noted.  Coordination: Cerebellar testing reveals good finger-nose-finger and heel-to-shin bilaterally.  Gait and station: Gait is normal.  Reflexes: Deep  tendon reflexes are symmetric and normal bilaterally.    DIAGNOSTIC DATA (LABS, IMAGING, TESTING) - I reviewed patient records, labs, notes, testing and imaging myself where available.  No flowsheet data found.   Lab Results  Component Value Date   WBC 7.3 04/30/2019   HGB 15.5 04/30/2019   HCT 45.4 04/30/2019   MCV 91 04/30/2019   PLT 235 04/30/2019      Component Value Date/Time   NA 143 04/30/2019 0932   K 4.2 04/30/2019 0932   CL 102 04/30/2019 0932   CO2 23 04/30/2019 0932   GLUCOSE 71 04/30/2019 0932   BUN 18 04/30/2019 0932   CREATININE 0.90 04/30/2019 0932   CALCIUM 9.9 04/30/2019 0932   PROT 7.0 04/30/2019 0932   ALBUMIN 4.5 04/30/2019 0932   AST 24 04/30/2019 0932   ALT 28 04/30/2019 0932   ALKPHOS 87 04/30/2019 0932   BILITOT 0.3 04/30/2019 0932   GFRNONAA 109 04/30/2019 0932   GFRAA 127 04/30/2019 0932   No results found for: CHOL, HDL, LDLCALC, LDLDIRECT, TRIG, CHOLHDL No results found for: ESPQ3R No results found for: VITAMINB12 No results found for: TSH     ASSESSMENT AND PLAN 39 y.o. year old male  has a past medical history of ADD (attention  deficit disorder), Hypertension, and Seizures ([facility]). here with     ICD-10-Willie Keller   1. Generalized idiopathic epilepsy, not intractable, without status epilepticus (HCC)  G40.309 Valproic Acid Level    CBC with Differential/Platelets    Hepatic Function Panel    Vitamin D, 25-hydroxy     Bertrum is doing well. We will continue divalproex ER 1000mg  daily. I will update labs today. He was encouraged to continue close follow up with PCP. Healthy lifestyle habits encouraged. He may return to PCP for continued refills, otherwise, he will follow up in 1 year, sooner if needed. He verbalizes understanding and agreement with this plan.   Orders Placed This Encounter  Procedures  . Valproic Acid Level  . CBC with Differential/Platelets  . Hepatic Function Panel  . Vitamin D, 25-hydroxy     Meds ordered this encounter  Medications  . divalproex (DEPAKOTE ER) 500 MG 24 hr tablet    Sig: Take 2 tablets (1,000 mg total) by mouth at bedtime.    Dispense:  180 tablet    Refill:  3    Order Specific Question:   Supervising Provider    Answer:   Anson Fret      I spent 25 minutes with the patient. 50% of this time was spent counseling and educating patient on plan of care and medications.    [id number], FNP-C 05/05/2020, 10:18 AM [facility] [address], [address] [place], [place] [phone]

## 2020-05-01 NOTE — Patient Instructions (Signed)
Continue divalproex ER 1000mg  daily. Stay well hydrated. Healthy lifestyle habits as discussed.   Follow up in 1 year   Seizure, Adult A seizure is a sudden burst of abnormal electrical activity in the brain. Seizures usually last from 30 seconds to 2 minutes. They can cause many different symptoms. Usually, seizures are not harmful unless they last a long time. What are the causes? Common causes of this condition include:  Fever or infection.  Conditions that affect the brain, such as: ? A brain abnormality that you were born with. ? A brain or head injury. ? Bleeding in the brain. ? A tumor. ? Stroke. ? Brain disorders such as autism or cerebral palsy.  Low blood sugar.  Conditions that are passed from parent to child (are inherited).  Problems with substances, such as: ? Having a reaction to a drug or a medicine. ? Suddenly stopping the use of a substance (withdrawal). In some cases, the cause may not be known. A person who has repeated seizures over time without a clear cause has a condition called epilepsy. What increases the risk? You are more likely to get this condition if you have:  A family history of epilepsy.  Had a seizure in the past.  A brain disorder.  A history of head injury, lack of oxygen at birth, or strokes. What are the signs or symptoms? There are many types of seizures. The symptoms vary depending on the type of seizure you have. Examples of symptoms during a seizure include:  Shaking (convulsions).  Stiffness in the body.  Passing out (losing consciousness).  Head nodding.  Staring.  Not responding to sound or touch.  Loss of bladder control and bowel control. Some people have symptoms right before and right after a seizure happens. Symptoms before a seizure may include:  Fear.  Worry (anxiety).  Feeling like you may vomit (nauseous).  Feeling like the room is spinning (vertigo).  Feeling like you saw or heard something before  (dj vu).  Odd tastes or smells.  Changes in how you see. You may see flashing lights or spots. Symptoms after a seizure happens can include:  Confusion.  Sleepiness.  Headache.  Weakness on one side of the body. How is this treated? Most seizures will stop on their own in under 5 minutes. In these cases, no treatment is needed. Seizures that last longer than 5 minutes will usually need treatment. Treatment can include:  Medicines given through an IV tube.  Avoiding things that are known to cause your seizures. These can include medicines that you take for another condition.  Medicines to treat epilepsy.  Surgery to stop the seizures. This may be needed if medicines do not help. Follow these instructions at home: Medicines  Take over-the-counter and prescription medicines only as told by your doctor.  Do not eat or drink anything that may keep your medicine from working, such as alcohol. Activity  Do not do any activities that would be dangerous if you had another seizure, like driving or swimming. Wait until your doctor says it is safe for you to do them.  If you live in the [place] ask your local DMV (department of motor vehicles) when you can drive.  Get plenty of rest. Teaching others Teach friends and family what to do when you have a seizure. They should:  Lay you on the ground.  Protect your head and body.  Loosen any tight clothing around your neck.  Turn you on your side.  Not hold you down.  Not put anything into your mouth.  Know whether or not you need emergency care.  Stay with you until you are better.  General instructions  Contact your doctor each time you have a seizure.  Avoid anything that gives you seizures.  Keep a seizure diary. Write down: ? What you think caused each seizure. ? What you remember about each seizure.  Keep all follow-up visits as told by your doctor. This is important. Contact a doctor if:  You have another  seizure.  You have seizures more often.  There is any change in what happens during your seizures.  You keep having seizures with treatment.  You have symptoms of being sick or having an infection. Get help right away if:  You have a seizure that: ? Lasts longer than 5 minutes. ? Is different than seizures you had before. ? Makes it harder to breathe. ? Happens after you hurt your head.  You have any of these symptoms after a seizure: ? Not being able to speak. ? Not being able to use a part of your body. ? Confusion. ? A bad headache.  You have two or more seizures in a row.  You do not wake up right after a seizure.  You get hurt during a seizure. These symptoms may be an emergency. Do not wait to see if the symptoms will go away. Get medical help right away. Call your local emergency services (911 in the [place] Do not drive yourself to the hospital. Summary  Seizures usually last from 30 seconds to 2 minutes. Usually, they are not harmful unless they last a long time.  Do not eat or drink anything that may keep your medicine from working, such as alcohol.  Teach friends and family what to do when you have a seizure.  Contact your doctor each time you have a seizure. This information is not intended to replace advice given to you by your health care provider. Make sure you discuss any questions you have with your health care provider. Document Revised: 10/13/2018 Document Reviewed: 10/13/2018 Elsevier Patient Education  [place].

## 2020-05-05 ENCOUNTER — Other Ambulatory Visit: Payer: Self-pay

## 2020-05-05 ENCOUNTER — Encounter: Payer: Self-pay | Admitting: Family Medicine

## 2020-05-05 ENCOUNTER — Ambulatory Visit (INDEPENDENT_AMBULATORY_CARE_PROVIDER_SITE_OTHER): Payer: 59 | Admitting: Family Medicine

## 2020-05-05 VITALS — BP 138/93 | HR 82 | Ht 73.0 in | Wt 230.0 lb

## 2020-05-05 DIAGNOSIS — G40309 Generalized idiopathic epilepsy and epileptic syndromes, not intractable, without status epilepticus: Secondary | ICD-10-CM

## 2020-05-05 MED ORDER — DIVALPROEX SODIUM ER 500 MG PO TB24: 1000.0000 mg | ORAL_TABLET | Freq: Every day | ORAL | 3 refills | Status: AC

## 2020-05-06 LAB — CBC WITH DIFFERENTIAL/PLATELET
Basophils Absolute: 0 10*3/uL (ref 0.0–0.2)
Basos: 1 %
EOS (ABSOLUTE): 0.7 10*3/uL — ABNORMAL HIGH (ref 0.0–0.4)
Eos: 11 %
Hematocrit: 45.9 % (ref 37.5–51.0)
Hemoglobin: 15.5 g/dL (ref 13.0–17.7)
Immature Grans (Abs): 0 10*3/uL (ref 0.0–0.1)
Immature Granulocytes: 0 %
Lymphocytes Absolute: 2 10*3/uL (ref 0.7–3.1)
Lymphs: 30 %
MCH: 30.5 pg (ref 26.6–33.0)
MCHC: 33.8 g/dL (ref 31.5–35.7)
MCV: 90 fL (ref 79–97)
Monocytes Absolute: 0.9 10*3/uL (ref 0.1–0.9)
Monocytes: 13 %
Neutrophils Absolute: 2.9 10*3/uL (ref 1.4–7.0)
Neutrophils: 45 %
Platelets: 256 10*3/uL (ref 150–450)
RBC: 5.08 x10E6/uL (ref 4.14–5.80)
RDW: 13.3 % (ref 11.6–15.4)
WBC: 6.5 10*3/uL (ref 3.4–10.8)

## 2020-05-06 LAB — HEPATIC FUNCTION PANEL
ALT: 44 IU/L (ref 0–44)
AST: 34 IU/L (ref 0–40)
Albumin: 4.4 g/dL (ref 4.0–5.0)
Alkaline Phosphatase: 90 IU/L (ref 44–121)
Bilirubin Total: 0.3 mg/dL (ref 0.0–1.2)
Bilirubin, Direct: <0.1 mg/dL (ref 0.00–0.40)
Total Protein: 7.4 g/dL (ref 6.0–8.5)

## 2020-05-06 LAB — VALPROIC ACID LEVEL: Valproic Acid Lvl: 39 ug/mL — ABNORMAL LOW (ref 50–100)

## 2020-05-06 LAB — VITAMIN D 25 HYDROXY (VIT D DEFICIENCY, FRACTURES): Vit D, 25-Hydroxy: 28.3 ng/mL — ABNORMAL LOW (ref 30.0–100.0)

## 2020-05-07 ENCOUNTER — Telehealth: Payer: Self-pay

## 2020-05-07 NOTE — Telephone Encounter (Signed)
-----   Message from Shawnie Dapper, NP sent at 05/07/2020 12:04 PM EDT ----- Labs are ok. Valproic acid levels are a touch low but this most likely has to do with timing of labs. We will continue current treatment plan. Vitamin D Still a little low but better than last visit. Continue Vitamin D Supplements OTC, 2,000iu daily.

## 2020-05-07 NOTE — Telephone Encounter (Signed)
Attempted to call the patient without success.  LM on the VM for the patient to call back re: recent results.  **If the patient calls back please relay the message below from Amy Lomax NP 

## 2020-05-13 NOTE — Progress Notes (Signed)
I reviewed note and agree with plan.   Orazio Weller R. Jakayla Schweppe, MD 05/13/2020, 1:16 PM Certified in Neurology, Neurophysiology and Neuroimaging  [facility] [address], [address] [place], [place] [address] [phone]  

## 2020-11-26 ENCOUNTER — Encounter (HOSPITAL_COMMUNITY): Payer: Self-pay | Admitting: Emergency Medicine

## 2020-11-26 ENCOUNTER — Ambulatory Visit (HOSPITAL_COMMUNITY)
Admission: EM | Admit: 2020-11-26 | Discharge: 2020-11-26 | Disposition: A | Discharge status: Home / Self Care | Payer: 59 | Attending: Family Medicine | Admitting: Family Medicine

## 2020-11-26 ENCOUNTER — Other Ambulatory Visit: Payer: Self-pay

## 2020-11-26 DIAGNOSIS — R1011 Right upper quadrant pain: Secondary | ICD-10-CM | POA: Insufficient documentation

## 2020-11-26 LAB — CBC WITH DIFFERENTIAL/PLATELET
Abs Immature Granulocytes: 0.02 10*3/uL (ref 0.00–0.07)
Basophils Absolute: 0 10*3/uL (ref 0.0–0.1)
Basophils Relative: 1 %
Eosinophils Absolute: 0.2 10*3/uL (ref 0.0–0.5)
Eosinophils Relative: 2 %
HCT: 47.4 % (ref 39.0–52.0)
Hemoglobin: 16.1 g/dL (ref 13.0–17.0)
Immature Granulocytes: 0 %
Lymphocytes Relative: 24 %
Lymphs Abs: 1.7 10*3/uL (ref 0.7–4.0)
MCH: 30.7 pg (ref 26.0–34.0)
MCHC: 34 g/dL (ref 30.0–36.0)
MCV: 90.5 fL (ref 80.0–100.0)
Monocytes Absolute: 0.9 10*3/uL (ref 0.1–1.0)
Monocytes Relative: 12 %
Neutro Abs: 4.3 10*3/uL (ref 1.7–7.7)
Neutrophils Relative %: 61 %
Platelets: 283 10*3/uL (ref 150–400)
RBC: 5.24 MIL/uL (ref 4.22–5.81)
RDW: 12.5 % (ref 11.5–15.5)
WBC: 7.2 10*3/uL (ref 4.0–10.5)
nRBC: 0 % (ref 0.0–0.2)

## 2020-11-26 LAB — COMPREHENSIVE METABOLIC PANEL
ALT: 28 U/L (ref 0–44)
AST: 22 U/L (ref 15–41)
Albumin: 3.7 g/dL (ref 3.5–5.0)
Alkaline Phosphatase: 69 U/L (ref 38–126)
Anion gap: 7 (ref 5–15)
BUN: 12 mg/dL (ref 6–20)
CO2: 28 mmol/L (ref 22–32)
Calcium: 9.1 mg/dL (ref 8.9–10.3)
Chloride: 101 mmol/L (ref 98–111)
Creatinine, Ser: 0.96 mg/dL (ref 0.61–1.24)
GFR, Estimated: >60 mL/min (ref >60)
Glucose, Bld: 94 mg/dL (ref 70–99)
Potassium: 3.9 mmol/L (ref 3.5–5.1)
Sodium: 136 mmol/L (ref 135–145)
Total Bilirubin: 0.7 mg/dL (ref 0.3–1.2)
Total Protein: 7.4 g/dL (ref 6.5–8.1)

## 2020-11-26 LAB — LIPASE, BLOOD: Lipase: 29 U/L (ref 11–51)

## 2020-11-26 NOTE — ED Provider Notes (Signed)
[facility]    CSN: [id number] Arrival date & time: 11/26/20  9476      History   Chief Complaint Chief Complaint  Patient presents with  . Abdominal Pain    HPI Willie Keller is a 40 y.o. male.   Presenting today with right upper quadrant pain x4 days.  States the day before onset of this pain he thinks he may have had some food poisoning from some moldy coffee creamer that he had as after 2 days of drinking it both days had significant stomach upset and bloating.  The symptoms have improved but now having localized right upper quadrant pain that is worse with movement and lifting objects.  He denies associated anorexia, nausea vomiting diarrhea, fevers, history of GI issues.  Tried some Flexeril and heat which did not seem to help so stopped this.  Rest does seem to alleviate the pain somewhat.     Past Medical History:  Diagnosis Date  . ADD (attention deficit disorder)   . Hypertension   . Seizures [facility])     Patient Active Problem List   Diagnosis Date Noted  . Therapeutic drug monitoring 04/13/2018  . Generalized idiopathic epilepsy, not intractable, without status epilepticus (HCC) 04/22/2015  . Generalized nonconvulsive epilepsy without mention of intractable epilepsy 06/01/2013    Past Surgical History:  Procedure Laterality Date  . PERCUTANEOUS FIXATION TIBIAL SHAFT FRACTURE W/ PINS / SCREWS  2009   clavicle        Home Medications    Prior to Admission medications   Medication Sig Start Date End Date Taking? Authorizing Provider  amLODipine (NORVASC) 10 MG tablet TAKE 1 TABLET BY MOUTH DAILY. KEEP TAKING BLOOD PRESSURE. GET LABWORK DONE 11/23/19  Yes [provider]  divalproex (DEPAKOTE ER) 500 MG 24 hr tablet Take 2 tablets (1,000 mg total) by mouth at bedtime. 05/05/20  Yes Lomax, Amy, NP  hydrochlorothiazide (HYDRODIURIL) 12.5 MG tablet Take 12.5 mg by mouth every morning. 09/19/20  Yes [provider]  omeprazole  (PRILOSEC) 20 MG capsule Take 20 mg by mouth daily.   Yes [provider]    Family History Family History  Problem Relation Age of Onset  . Healthy Mother   . Healthy Father   . Healthy Sister     Social History Social History   Tobacco Use  . Smoking status: Current Some Day Smoker    Types: Cigars  . Smokeless tobacco: Never Used  . Tobacco comment: 04/13/16 cigars once a month  Substance Use Topics  . Alcohol use: Yes    Alcohol/week: 4.0 standard drinks    Types: 4 Shots of liquor per week  . Drug use: No     Allergies   Patient has no known allergies.   Review of Systems Review of Systems Per HPI  Physical Exam Triage Vital Signs ED Triage Vitals  Enc Vitals Group     BP 11/26/20 1012 118/81     Pulse Rate 11/26/20 1012 95     Resp --      Temp 11/26/20 1012 98.5 F (36.9 C)     Temp Source 11/26/20 1012 Oral     SpO2 11/26/20 1012 98 %     Weight --      Height --      Head Circumference --      Peak Flow --      Pain Score 11/26/20 1014 4     Pain Loc --  Pain Edu? --      Excl. in GC? --    No data found.  Updated Vital Signs BP 118/81 (BP Location: Right Arm)   Pulse 95   Temp 98.5 F (36.9 C) (Oral)   SpO2 98%   Visual Acuity Right Eye Distance:   Left Eye Distance:   Bilateral Distance:    Right Eye Near:   Left Eye Near:    Bilateral Near:     Physical Exam Vitals and nursing note reviewed.  Constitutional:      Appearance: Normal appearance.  HENT:     Head: Atraumatic.     Mouth/Throat:     Mouth: Mucous membranes are moist.     Pharynx: Oropharynx is clear.  Eyes:     Extraocular Movements: Extraocular movements intact.     Conjunctiva/sclera: Conjunctivae normal.  Cardiovascular:     Rate and Rhythm: Normal rate and regular rhythm.  Pulmonary:     Effort: Pulmonary effort is normal. No respiratory distress.     Breath sounds: Normal breath sounds. No wheezing or rales.  Abdominal:     General: Bowel  sounds are normal. There is no distension.     Palpations: Abdomen is soft. There is no mass.     Tenderness: There is abdominal tenderness. There is guarding. There is no right CVA tenderness or left CVA tenderness.     Comments: Significant right upper quadrant tenderness palpation with positive Murphy sign.  Musculoskeletal:        General: Normal range of motion.     Cervical back: Normal range of motion and neck supple.  Skin:    General: Skin is warm and dry.  Neurological:     General: No focal deficit present.     Mental Status: He is oriented to person, place, and time.  Psychiatric:        Mood and Affect: Mood normal.        Thought Content: Thought content normal.        Judgment: Judgment normal.     UC Treatments / Results  Labs (all labs ordered are listed, but only abnormal results are displayed) Labs Reviewed  CBC WITH DIFFERENTIAL/PLATELET  COMPREHENSIVE METABOLIC PANEL  LIPASE, BLOOD   EKG  Radiology No results found.  Procedures Procedures (including critical care time)  Medications Ordered in UC Medications - No data to display  Initial Impression / Assessment and Plan / UC Course  I have reviewed the triage vital signs and the nursing notes.  Pertinent labs & imaging results that were available during my care of the patient were reviewed by me and considered in my medical decision making (see chart for details).     Overall very well-appearing with stable vital signs, but given significant right upper quadrant abdominal pain without obvious cause do recommend he present either at the ED or his primary care for stat outpatient imaging of this area.  He has remained afebrile, tolerating p.o. well.  Will get basic labs today for rule out and discussed supportive care measures, strict return precautions for worsening symptoms.  He wishes to get these labs back and watch and wait, try to get in with his PCP rather than going to the ED at this  time.  Final Clinical Impressions(s) / UC Diagnoses   Final diagnoses:  RUQ pain     Discharge Instructions     Go to the ED immediately if your symptoms worsen at any time or if not resolving  and unable to get in with your primary care    ED Prescriptions    None     PDMP not reviewed this encounter.   Particia Nearing, [place] 11/26/20 1909

## 2020-11-26 NOTE — Discharge Instructions (Signed)
Go to the ED immediately if your symptoms worsen at any time or if not resolving and unable to get in with your primary care

## 2020-11-26 NOTE — ED Triage Notes (Signed)
Patient c/o right upper quadrant pain since Saturday, food poisoning on Friday morning.  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 No nausea, diarrhea, vomiting.  Normal BM.  Patient has taken Naproxen for pain.

## 2020-11-28 ENCOUNTER — Emergency Department (HOSPITAL_BASED_OUTPATIENT_CLINIC_OR_DEPARTMENT_OTHER): Payer: 59

## 2020-11-28 ENCOUNTER — Other Ambulatory Visit: Payer: Self-pay

## 2020-11-28 ENCOUNTER — Encounter (HOSPITAL_BASED_OUTPATIENT_CLINIC_OR_DEPARTMENT_OTHER): Payer: Self-pay | Admitting: *Deleted

## 2020-11-28 ENCOUNTER — Emergency Department (HOSPITAL_BASED_OUTPATIENT_CLINIC_OR_DEPARTMENT_OTHER)
Admission: EM | Admit: 2020-11-28 | Discharge: 2020-11-28 | Disposition: A | Discharge status: Home / Self Care | Payer: 59 | Attending: Emergency Medicine | Admitting: Emergency Medicine

## 2020-11-28 DIAGNOSIS — F17290 Nicotine dependence, other tobacco product, uncomplicated: Secondary | ICD-10-CM | POA: Diagnosis not present

## 2020-11-28 DIAGNOSIS — R1011 Right upper quadrant pain: Secondary | ICD-10-CM | POA: Insufficient documentation

## 2020-11-28 DIAGNOSIS — I10 Essential (primary) hypertension: Secondary | ICD-10-CM | POA: Insufficient documentation

## 2020-11-28 DIAGNOSIS — Z79899 Other long term (current) drug therapy: Secondary | ICD-10-CM | POA: Diagnosis not present

## 2020-11-28 LAB — COMPREHENSIVE METABOLIC PANEL
ALT: 30 U/L (ref 0–44)
AST: 20 U/L (ref 15–41)
Albumin: 4.3 g/dL (ref 3.5–5.0)
Alkaline Phosphatase: 67 U/L (ref 38–126)
Anion gap: 11 (ref 5–15)
BUN: 16 mg/dL (ref 6–20)
CO2: 24 mmol/L (ref 22–32)
Calcium: 9.8 mg/dL (ref 8.9–10.3)
Chloride: 103 mmol/L (ref 98–111)
Creatinine, Ser: 0.83 mg/dL (ref 0.61–1.24)
GFR, Estimated: >60 mL/min (ref >60)
Glucose, Bld: 114 mg/dL — ABNORMAL HIGH (ref 70–99)
Potassium: 3.8 mmol/L (ref 3.5–5.1)
Sodium: 138 mmol/L (ref 135–145)
Total Bilirubin: 0.3 mg/dL (ref 0.3–1.2)
Total Protein: 7.9 g/dL (ref 6.5–8.1)

## 2020-11-28 LAB — CBC WITH DIFFERENTIAL/PLATELET
Abs Immature Granulocytes: 0.02 10*3/uL (ref 0.00–0.07)
Basophils Absolute: 0.1 10*3/uL (ref 0.0–0.1)
Basophils Relative: 1 %
Eosinophils Absolute: 0.4 10*3/uL (ref 0.0–0.5)
Eosinophils Relative: 5 %
HCT: 46.3 % (ref 39.0–52.0)
Hemoglobin: 16.1 g/dL (ref 13.0–17.0)
Immature Granulocytes: 0 %
Lymphocytes Relative: 25 %
Lymphs Abs: 2 10*3/uL (ref 0.7–4.0)
MCH: 31 pg (ref 26.0–34.0)
MCHC: 34.8 g/dL (ref 30.0–36.0)
MCV: 89.2 fL (ref 80.0–100.0)
Monocytes Absolute: 0.7 10*3/uL (ref 0.1–1.0)
Monocytes Relative: 8 %
Neutro Abs: 5.1 10*3/uL (ref 1.7–7.7)
Neutrophils Relative %: 61 %
Platelets: 310 10*3/uL (ref 150–400)
RBC: 5.19 MIL/uL (ref 4.22–5.81)
RDW: 12.3 % (ref 11.5–15.5)
WBC: 8.3 10*3/uL (ref 4.0–10.5)
nRBC: 0 % (ref 0.0–0.2)

## 2020-11-28 LAB — LIPASE, BLOOD: Lipase: 35 U/L (ref 11–51)

## 2020-11-28 NOTE — Discharge Instructions (Addendum)
Work-up here today without any lab abnormalities.  Ultrasound negative.  In particular no evidence of gallstones.  Liver labs and pancreatic labs showed no signs of any problems with the liver or gallbladder or pancreas.  Would recommend make an appointment follow-up with your primary care doctor.  Further evaluation of the gallbladder with a HIDA scan may be warranted.  Return for any new or worse symptoms.

## 2020-11-28 NOTE — ED Triage Notes (Signed)
Right side abdominal pain since Saturday.  Denies nausea or vomiting, fever or diarrhea.

## 2020-11-28 NOTE — ED Provider Notes (Addendum)
[facility] EMERGENCY DEPT Provider Note   CSN: [id number] Arrival date & time: 11/28/20  1324     History Chief Complaint  Patient presents with  . Abdominal Pain    Willie Keller is a 40 y.o. male.  Patient with 1 week history of right upper quadrant abdominal pain.  Was seen at urgent care on April 20.  They recommend that he get seen by primary care doctor or emergency department for further imaging for the possibility of gallstone problems.  Patient's labs on that day were very normal to include liver function test.  Patient still has his gallbladder.  Patient without any nausea or vomiting.  No shortness of breath.  It does hurt more to take a deep breath.  Patient's been taking naproxen for pain.        Past Medical History:  Diagnosis Date  . ADD (attention deficit disorder)   . Hypertension   . Seizures [facility])     Patient Active Problem List   Diagnosis Date Noted  . Therapeutic drug monitoring 04/13/2018  . Generalized idiopathic epilepsy, not intractable, without status epilepticus (HCC) 04/22/2015  . Generalized nonconvulsive epilepsy without mention of intractable epilepsy 06/01/2013    Past Surgical History:  Procedure Laterality Date  . ORIF CLAVICULAR FRACTURE    . PERCUTANEOUS FIXATION TIBIAL SHAFT FRACTURE W/ PINS / SCREWS  2009   clavicle        Family History  Problem Relation Age of Onset  . Healthy Mother   . Healthy Father   . Healthy Sister     Social History   Tobacco Use  . Smoking status: Light Tobacco Smoker    Types: Cigars  . Smokeless tobacco: Never Used  . Tobacco comment: 04/13/16 cigars once a month  Substance Use Topics  . Alcohol use: Yes    Alcohol/week: 4.0 standard drinks    Types: 4 Shots of liquor per week    Comment: occasionally  . Drug use: No    Home Medications Prior to Admission medications   Medication Sig Start Date End Date Taking? Authorizing Provider  amLODipine (NORVASC) 10 MG  tablet TAKE 1 TABLET BY MOUTH DAILY. KEEP TAKING BLOOD PRESSURE. GET LABWORK DONE 11/23/19  Yes [provider]  divalproex (DEPAKOTE ER) 500 MG 24 hr tablet Take 2 tablets (1,000 mg total) by mouth at bedtime. 05/05/20  Yes Lomax, Amy, NP  hydrochlorothiazide (HYDRODIURIL) 12.5 MG tablet Take 12.5 mg by mouth every morning. 09/19/20  Yes [provider]  omeprazole (PRILOSEC) 20 MG capsule Take 20 mg by mouth daily.   Yes [provider]    Allergies    Patient has no known allergies.  Review of Systems   Review of Systems  Constitutional: Negative for chills and fever.  HENT: Negative for congestion, rhinorrhea and sore throat.   Eyes: Negative for visual disturbance.  Respiratory: Negative for cough and shortness of breath.   Cardiovascular: Negative for chest pain and leg swelling.  Gastrointestinal: Positive for abdominal pain. Negative for diarrhea, nausea and vomiting.  Genitourinary: Negative for dysuria.  Musculoskeletal: Negative for back pain and neck pain.  Skin: Negative for rash.  Neurological: Negative for dizziness, light-headedness and headaches.  Hematological: Does not bruise/bleed easily.  Psychiatric/Behavioral: Negative for confusion.    Physical Exam Updated Vital Signs BP (!) 139/92   Pulse 75   Temp 99.2 F (37.3 C) (Oral)   Resp 16   Ht 1.88 m (6\' 2" )   Wt 104.3  kg   SpO2 99%   BMI 29.53 kg/m   Physical Exam Vitals and nursing note reviewed.  Constitutional:      General: He is not in acute distress.    Appearance: Normal appearance. He is well-developed.  HENT:     Head: Normocephalic and atraumatic.  Eyes:     Conjunctiva/sclera: Conjunctivae normal.  Cardiovascular:     Rate and Rhythm: Normal rate and regular rhythm.     Heart sounds: No murmur heard.   Pulmonary:     Effort: Pulmonary effort is normal. No respiratory distress.     Breath sounds: Normal breath sounds.  Abdominal:     General: There is no  distension.     Palpations: Abdomen is soft. There is mass.     Tenderness: There is no abdominal tenderness. There is no guarding.  Musculoskeletal:        General: No swelling. Normal range of motion.     Cervical back: Neck supple.  Skin:    General: Skin is warm and dry.     Capillary Refill: Capillary refill takes less than 2 seconds.  Neurological:     General: No focal deficit present.     Mental Status: He is alert and oriented to person, place, and time.     Cranial Nerves: No cranial nerve deficit.     Sensory: No sensory deficit.     Motor: No weakness.     ED Results / Procedures / Treatments   Labs (all labs ordered are listed, but only abnormal results are displayed) Labs Reviewed  COMPREHENSIVE METABOLIC PANEL - Abnormal; Notable for the following components:      Result Value   Glucose, Bld 114 (*)    All other components within normal limits  CBC WITH DIFFERENTIAL/PLATELET  LIPASE, BLOOD  URINALYSIS, ROUTINE W REFLEX MICROSCOPIC    Results for orders placed or performed during the hospital encounter of 11/28/20  CBC with Differential/Platelet  Result Value Ref Range   WBC 8.3 4.0 - 10.5 K/uL   RBC 5.19 4.22 - 5.81 MIL/uL   Hemoglobin 16.1 13.0 - 17.0 g/dL   HCT 32.4 40.1 - 02.7 %   MCV 89.2 80.0 - 100.0 fL   MCH 31.0 26.0 - 34.0 pg   MCHC 34.8 30.0 - 36.0 g/dL   RDW 25.3 66.4 - 40.3 %   Platelets 310 150 - 400 K/uL   nRBC 0.0 0.0 - 0.2 %   Neutrophils Relative % 61 %   Neutro Abs 5.1 1.7 - 7.7 K/uL   Lymphocytes Relative 25 %   Lymphs Abs 2.0 0.7 - 4.0 K/uL   Monocytes Relative 8 %   Monocytes Absolute 0.7 0.1 - 1.0 K/uL   Eosinophils Relative 5 %   Eosinophils Absolute 0.4 0.0 - 0.5 K/uL   Basophils Relative 1 %   Basophils Absolute 0.1 0.0 - 0.1 K/uL   Immature Granulocytes 0 %   Abs Immature Granulocytes 0.02 0.00 - 0.07 K/uL  Comprehensive metabolic panel  Result Value Ref Range   Sodium 138 135 - 145 mmol/L   Potassium 3.8 3.5 - 5.1  mmol/L   Chloride 103 98 - 111 mmol/L   CO2 24 22 - 32 mmol/L   Glucose, Bld 114 (H) 70 - 99 mg/dL   BUN 16 6 - 20 mg/dL   Creatinine, Ser 4.74 0.61 - 1.24 mg/dL   Calcium 9.8 8.9 - 25.9 mg/dL   Total Protein 7.9 6.5 - 8.1 g/dL  Albumin 4.3 3.5 - 5.0 g/dL   AST 20 15 - 41 U/L   ALT 30 0 - 44 U/L   Alkaline Phosphatase 67 38 - 126 U/L   Total Bilirubin 0.3 0.3 - 1.2 mg/dL   GFR, Estimated >16 >60 mL/min   Anion gap 11 5 - 15  Lipase, blood  Result Value Ref Range   Lipase 35 11 - 51 U/L      EKG None  Radiology DG Chest Port 1 View  Result Date: 11/28/2020 CLINICAL DATA:  Acute right upper quadrant abdominal pain. EXAM: PORTABLE CHEST 1 VIEW COMPARISON:  None. FINDINGS: The heart size and mediastinal contours are within normal limits. Both lungs are clear. The visualized skeletal structures are unremarkable. IMPRESSION: No active disease. Electronically Signed   By: Lupita Raider M.D.   On: 11/28/2020 14:22    Procedures Procedures  Medications Ordered in ED Medications - No data to display  ED Course  I have reviewed the triage vital signs and the nursing notes.  Pertinent labs & imaging results that were available during my care of the patient were reviewed by me and considered in my medical decision making (see chart for details).    MDM Rules/Calculators/A&P                          Patient with 1 week history of right upper quadrant pain.  Labs on April 20 were normal.  Patient without any tenderness in the right upper quadrant.  Have ordered ultrasound to rule out gallstone disease.  And repeated labs.  Patient in no acute distress.  CBC complete metabolic panel lipase all normal.  Urinalysis has been ordered as well.  Ultrasound of right upper quadrant pending.  Chest x-ray negative.  Ultrasound without evidence of any gallstones.  Gallbladder was contracted.  Dysfunctional gallbladder not completely ruled out.  Labs very reassuring.  Patient stable for  follow-up with primary care doctor.  They may want to consider HIDA scan.  Patient nontoxic no acute distress.  Patient is already on proton inhibitor.  I will have him follow-up with his primary care doctor.   Final Clinical Impression(s) / ED Diagnoses Final diagnoses:  Right upper quadrant abdominal pain    Rx / DC Orders ED Discharge Orders    None       Vanetta Mulders, MD 11/28/20 1444    Vanetta Mulders, MD 11/28/20 1445    Vanetta Mulders, MD 11/28/20 [phone]

## 2022-07-03 IMAGING — DX DG CHEST 1V PORT
1 series · 1 of 1 positions shown · non-contrast
Comparison: None.

CLINICAL DATA: Acute right upper quadrant abdominal pain.

EXAM:
PORTABLE CHEST 1 VIEW

[chest]
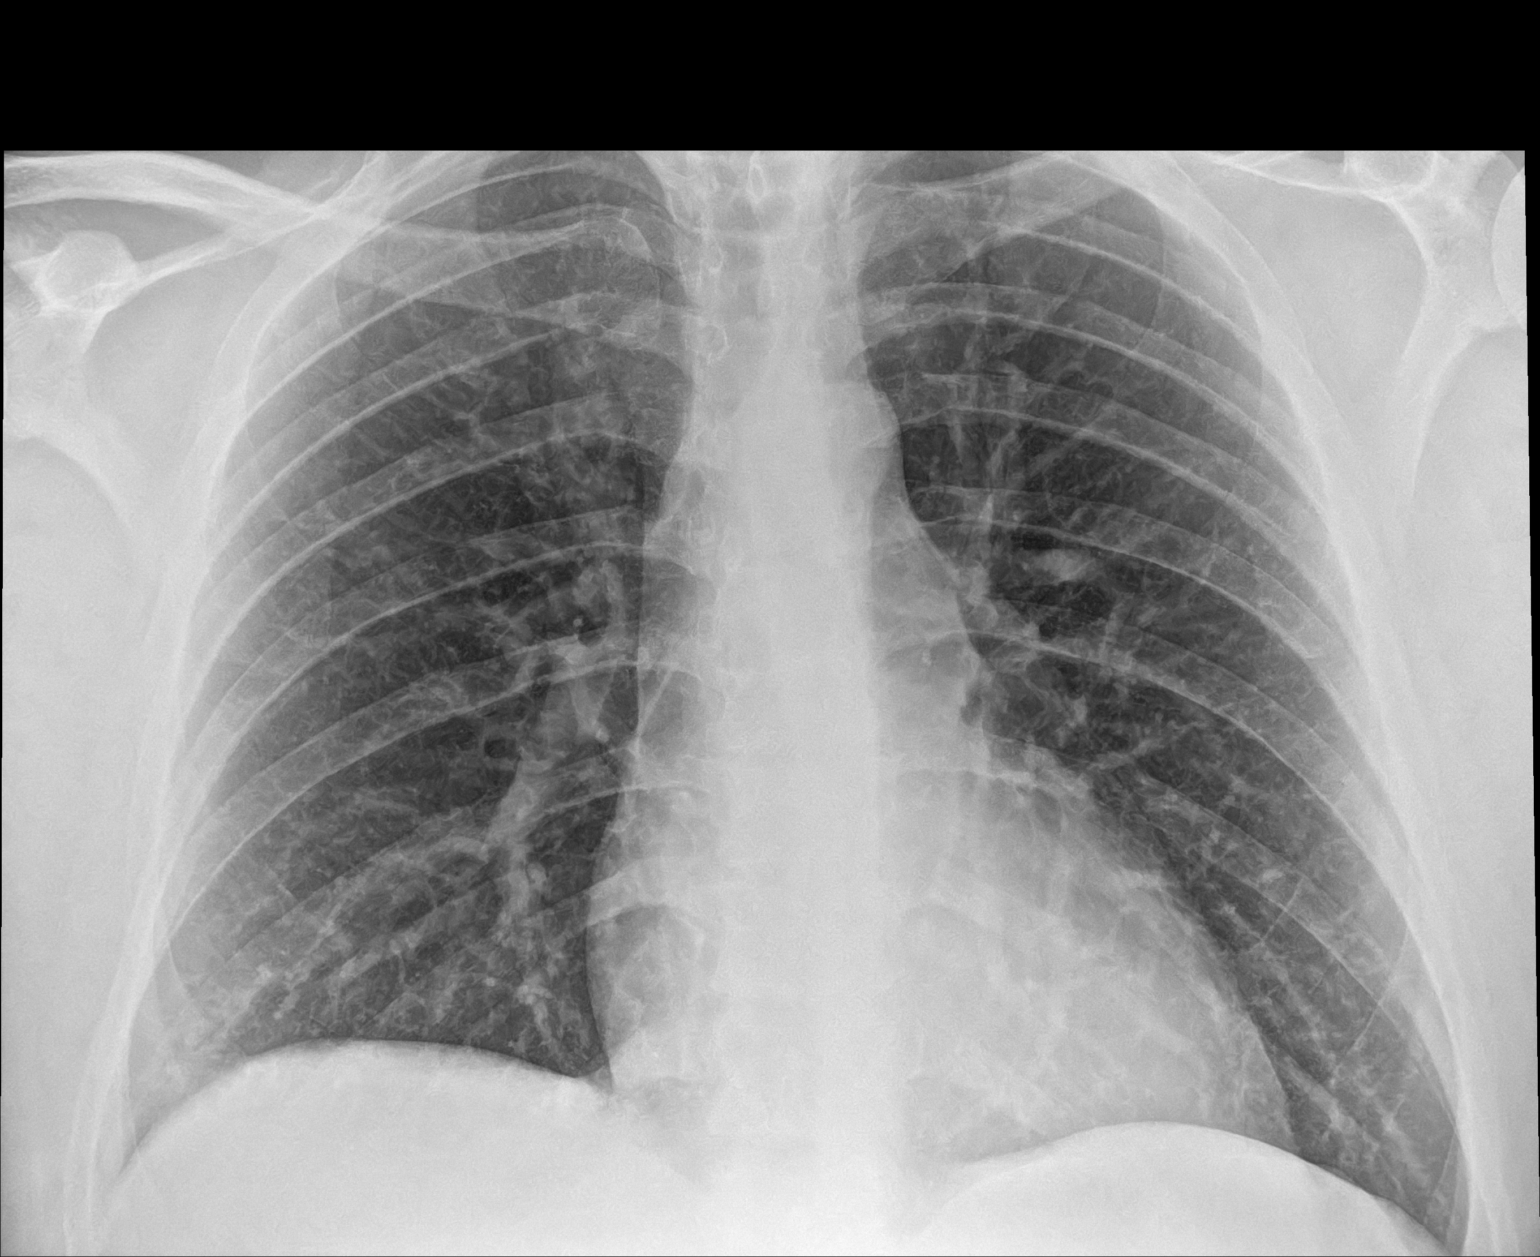

[1 of 1 positions shown; findings below may reference images not displayed]

FINDINGS: The heart size and mediastinal contours are within normal limits.
Both lungs are clear. The visualized skeletal structures are
unremarkable.
IMPRESSION: No active disease.

## 2022-07-03 IMAGING — US US ABDOMEN COMPLETE
1 series · 14 of 25 positions shown · non-contrast
Comparison: None.

CLINICAL DATA: Focal right upper quadrant pain over the last week.

EXAM:
ABDOMEN ULTRASOUND COMPLETE

[Series 1: us abdomen complete · 14 of 149 slices shown]
[im 1/149]
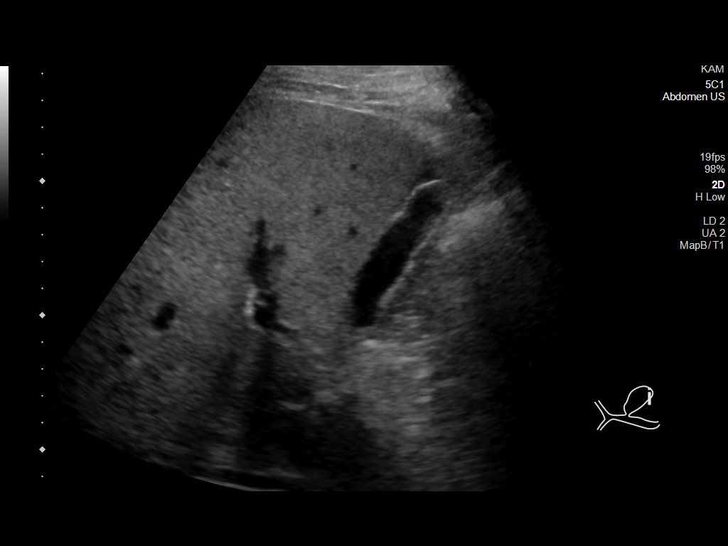
[im 13/149]
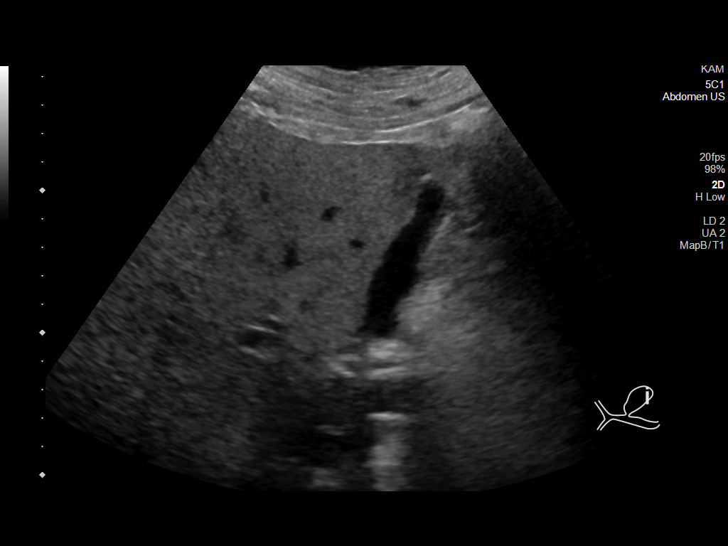
[im 25/149]
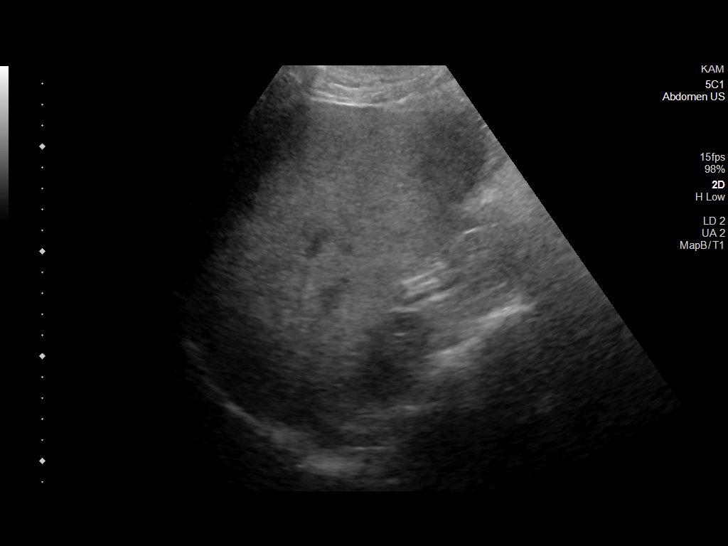
[im 38/149]
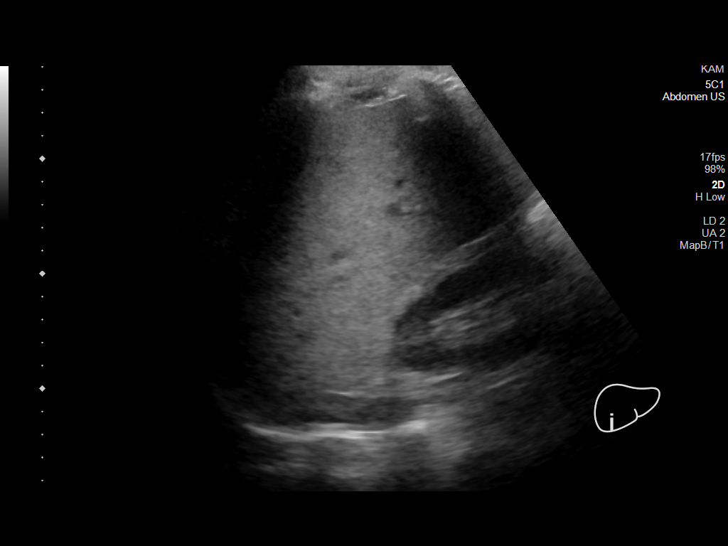
[im 50/149]
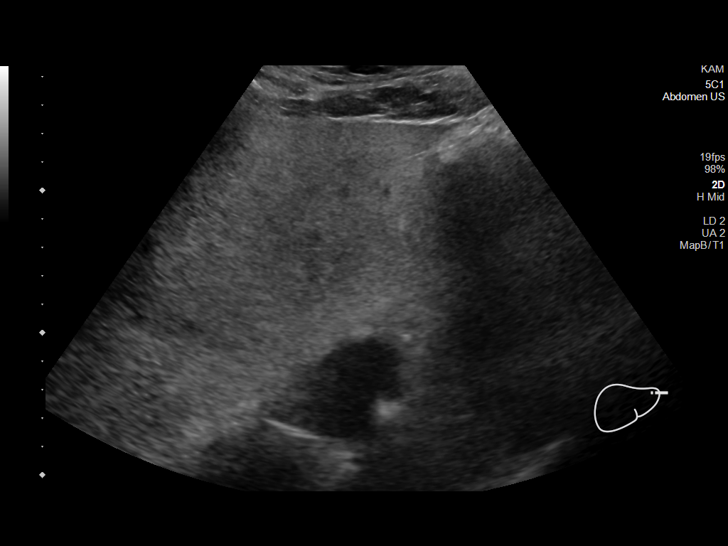
[im 56/149]
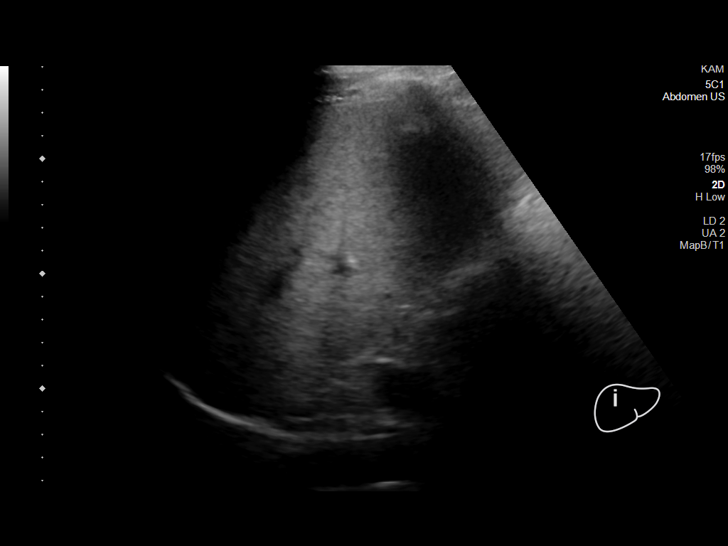
[im 68/149]
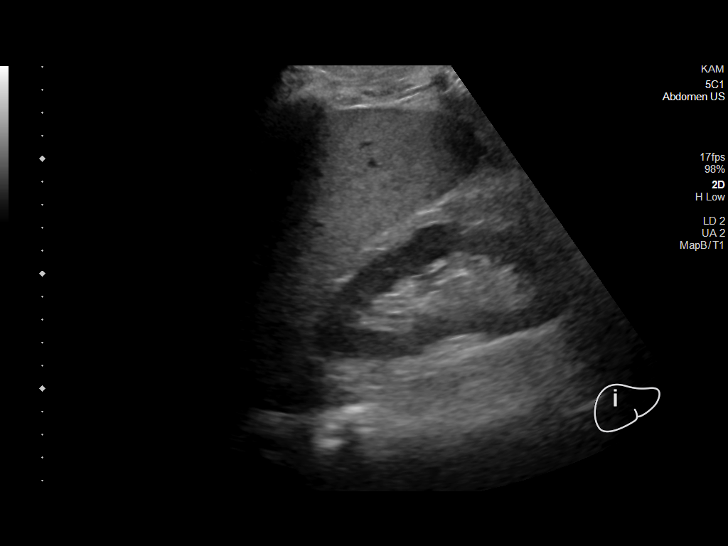
[im 81/149]
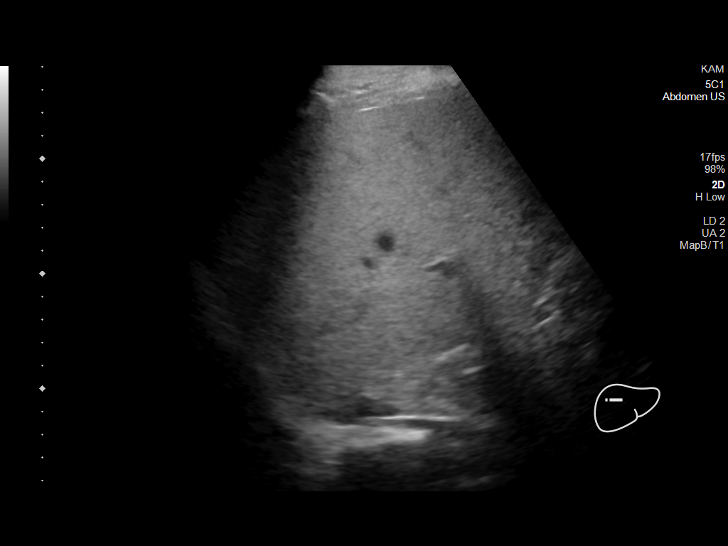
[im 93/149]
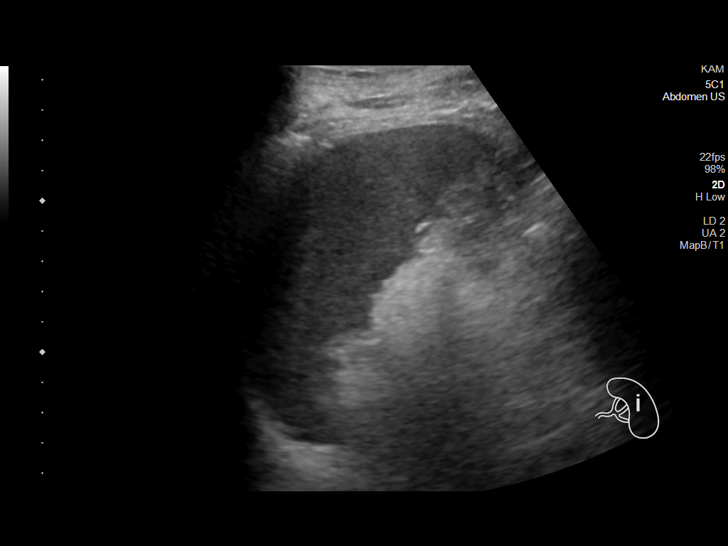
[im 99/149]
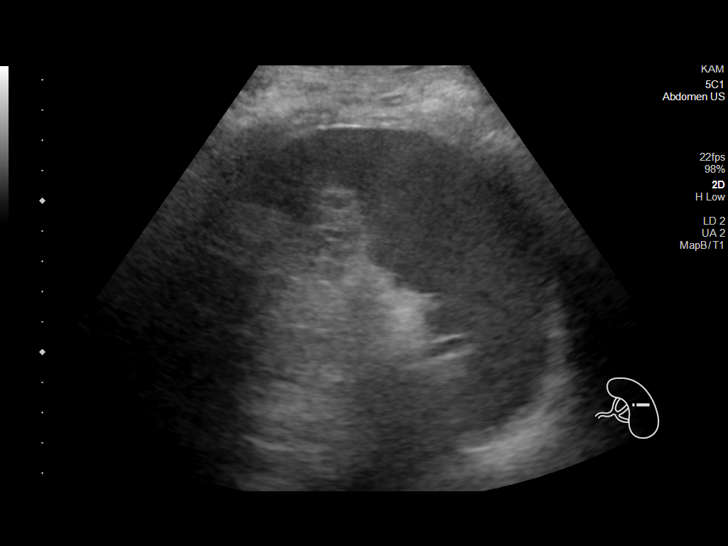
[im 112/149]
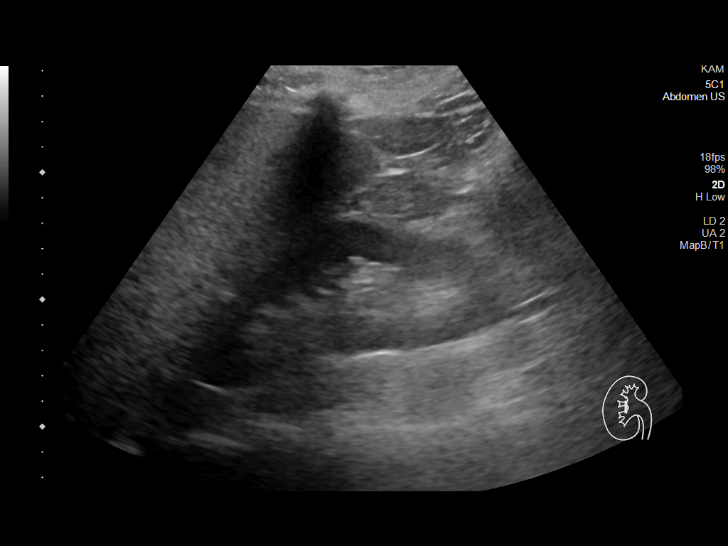
[im 124/149]
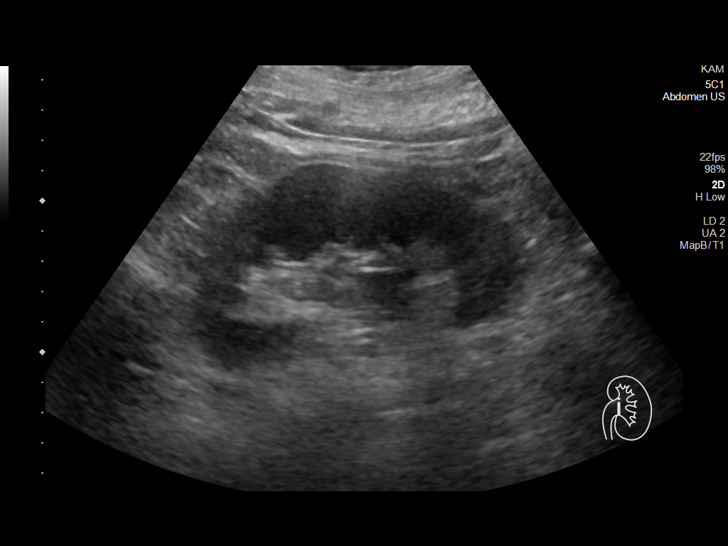
[im 136/149]
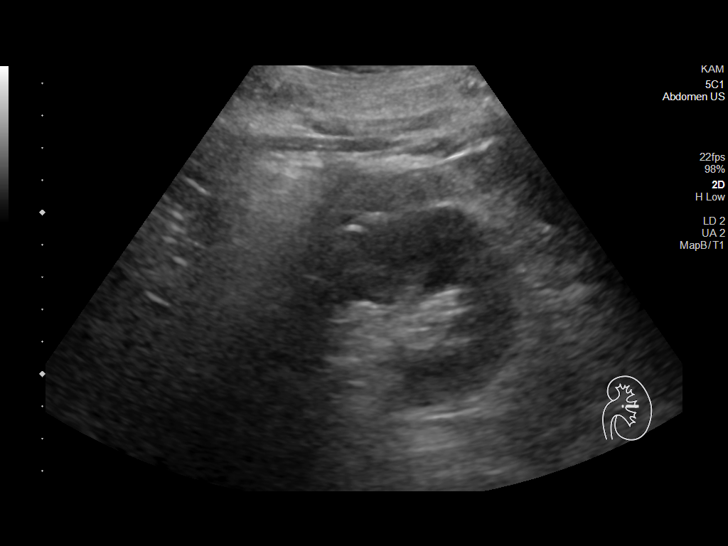
[im 149/149]
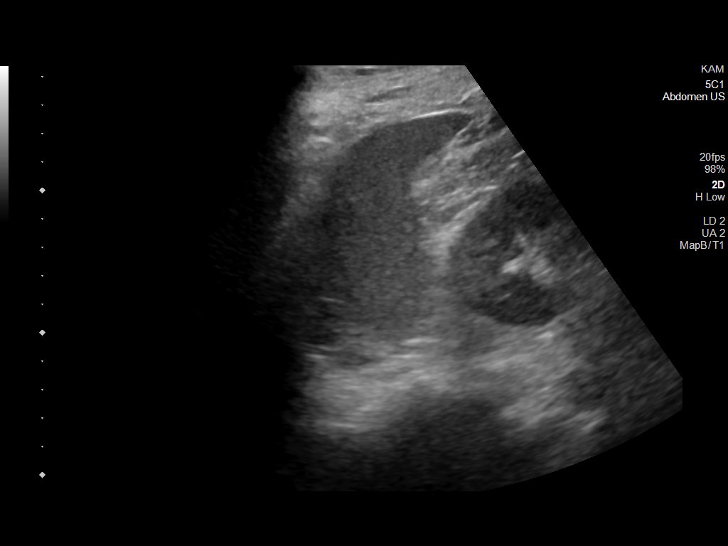

[14 of 25 positions shown; findings below may reference images not displayed]

FINDINGS: Gallbladder: Contracted gallbladder due to recent fatty meal. No
stone identified.

Common bile duct: Diameter: 4 mm.  Normal.

Liver: Echogenic consistent with fatty change. No focal lesion. No
ductal dilatation. Portal vein is patent on color Doppler imaging
with normal direction of blood flow towards the liver.

IVC: Poorly seen because of overlying bowel gas.

Pancreas: Poorly seen because of overlying bowel gas.

Spleen: Size and appearance within normal limits.

Right Kidney: Length: Poorly seen because of bowel gas and body
habitus. Kidney appears to be 14 cm in length. No hydronephrosis.

Left Kidney: Length: 12.4 cm. Poorly visualized because of bowel gas
and body habitus. No hydronephrosis.

Abdominal aorta: Poorly seen because of overlying bowel gas.

Other findings: None.
IMPRESSION: Fatty liver. Contracted gallbladder due to recent fatty meal. No
stone seen.

Limited visualization of the other organs because of body habitus
and large amount of bowel gas. No pathologic finding identified. See
above.
